# Patient Record
Sex: Male | Born: 1965 | Race: White | Hispanic: No | Marital: Married | Smoking: Current every day smoker
Health system: Southern US, Community
[De-identification: ages and names within clinical notes are randomized; demographics above are authoritative.]

## PROBLEM LIST (undated history)

## (undated) DIAGNOSIS — B029 Zoster without complications: Secondary | ICD-10-CM

## (undated) DIAGNOSIS — T300 Burn of unspecified body region, unspecified degree: Secondary | ICD-10-CM

## (undated) HISTORY — DX: Zoster without complications: B02.9

## (undated) HISTORY — DX: Burn of unspecified body region, unspecified degree: T30.0

---

## 2004-03-20 ENCOUNTER — Ambulatory Visit (HOSPITAL_COMMUNITY): Admission: RE | Admit: 2004-03-20 | Discharge: 2004-03-20 | Payer: Self-pay | Admitting: Family Medicine

## 2012-09-04 ENCOUNTER — Telehealth: Payer: Self-pay | Admitting: Nurse Practitioner

## 2012-09-04 NOTE — Telephone Encounter (Signed)
Long history of L knee pain with continuous pain x 6 months.  It has gotten progressively more unstable recently.  The discomfort is also affecting his sleep.  Has not taken anything OTC or applied a brace.  He has not been evaluated for this pain in the past.    Appt scheduled for tomorrow.  Suggested ibuprofen 2 tabs q 4 hrs or 4 tabs q 8 hrs PRN pain.

## 2012-09-05 ENCOUNTER — Ambulatory Visit (INDEPENDENT_AMBULATORY_CARE_PROVIDER_SITE_OTHER): Payer: BC Managed Care – PPO

## 2012-09-05 ENCOUNTER — Ambulatory Visit (INDEPENDENT_AMBULATORY_CARE_PROVIDER_SITE_OTHER): Payer: BC Managed Care – PPO | Admitting: Family Medicine

## 2012-09-05 ENCOUNTER — Encounter: Payer: Self-pay | Admitting: Family Medicine

## 2012-09-05 VITALS — BP 126/78 | HR 69 | Temp 98.8°F | Ht 69.0 in | Wt 198.0 lb

## 2012-09-05 DIAGNOSIS — S83419A Sprain of medial collateral ligament of unspecified knee, initial encounter: Secondary | ICD-10-CM

## 2012-09-05 DIAGNOSIS — Z23 Encounter for immunization: Secondary | ICD-10-CM

## 2012-09-05 DIAGNOSIS — M25569 Pain in unspecified knee: Secondary | ICD-10-CM

## 2012-09-05 DIAGNOSIS — S83412A Sprain of medial collateral ligament of left knee, initial encounter: Secondary | ICD-10-CM

## 2012-09-05 DIAGNOSIS — M25562 Pain in left knee: Secondary | ICD-10-CM

## 2012-09-05 MED ORDER — MELOXICAM 15 MG PO TABS: 15.0000 mg | ORAL_TABLET | Freq: Every day | ORAL | Status: DC

## 2012-09-05 NOTE — Patient Instructions (Signed)

## 2012-09-11 ENCOUNTER — Telehealth: Payer: Self-pay

## 2012-09-11 NOTE — Telephone Encounter (Signed)
Patient says he is supposed to be referred to Dr. Thurston Hole   (Nothing in referral pool)

## 2012-09-12 ENCOUNTER — Telehealth: Payer: Self-pay

## 2012-09-12 NOTE — Telephone Encounter (Signed)
Patient thought you were referring him to Dr Thurston Hole    Nothing in referral workqueue

## 2012-09-14 NOTE — Telephone Encounter (Signed)
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  I will complete his note by the end of the day.  Thank you.

## 2012-11-14 NOTE — Progress Notes (Signed)
  Subjective:    Patient ID: Russell Shaw, male    DOB: November 06, 1965, 47 y.o.   MRN: [id number]  HPI Comments: Pt with baseline hx/o chronic knee pain  Has had worsening L knee pain over last 1-2 weeks.  No known injury.  Does manual labor on a regular basis.  No knee locking or giving away.  Has not tried anything acutely for treatment.    Knee Pain  The incident occurred more than 1 week ago. There was no injury mechanism. The pain is present in the left knee. The quality of the pain is described as aching. The pain is at a severity of 4/10. The pain is mild.      Review of Systems  All other systems reviewed and are negative.       Objective:   Physical Exam  Constitutional: He appears well-developed and well-nourished.  HENT:  Head: Normocephalic and atraumatic.  Eyes: Conjunctivae are normal. Pupils are equal, round, and reactive to light.  Neck: Normal range of motion.  Cardiovascular: Normal rate and regular rhythm.   Pulmonary/Chest: Effort normal.  Abdominal: Soft.  Musculoskeletal:  + mild pain along L medial knee joint line  Full ROM  Mild pain with resisted knee extension McMurrays mildly positive on R.    Neurological: He is alert.   [facility] reading (PRIMARY) by  Dr. Alvester Morin  Preliminary read with noted medial joint space narrowing as well as mild degenerative changes.                                          Assessment & Plan:  Need for Tdap vaccination - Plan: Tdap vaccine greater than or equal to 7yo IM  Knee pain, left - Plan: DG Knee 1-2 Views Left, meloxicam (MOBIC) 15 MG tablet  Sprain and strain of medial collateral ligament of knee, left, initial encounter - Plan: meloxicam (MOBIC) 15 MG tablet  RICE and NSAIDs.  Will brace  Discussed general care and [facility] red flags.  Consider follow up with ortho/sports medicine if sxs persist.

## 2013-02-15 HISTORY — PX: MENISCUS REPAIR: SHX5179

## 2015-05-13 ENCOUNTER — Encounter: Payer: Self-pay | Admitting: Family Medicine

## 2015-05-13 ENCOUNTER — Ambulatory Visit (INDEPENDENT_AMBULATORY_CARE_PROVIDER_SITE_OTHER): Payer: BLUE CROSS/BLUE SHIELD | Admitting: Family Medicine

## 2015-05-13 VITALS — BP 110/75 | HR 85 | Temp 99.3°F | Wt 197.4 lb

## 2015-05-13 DIAGNOSIS — M791 Myalgia, unspecified site: Secondary | ICD-10-CM

## 2015-05-13 DIAGNOSIS — J101 Influenza due to other identified influenza virus with other respiratory manifestations: Secondary | ICD-10-CM

## 2015-05-13 DIAGNOSIS — L200 Besnier's prurigo: Secondary | ICD-10-CM | POA: Diagnosis not present

## 2015-05-13 DIAGNOSIS — R509 Fever, unspecified: Secondary | ICD-10-CM | POA: Diagnosis not present

## 2015-05-13 DIAGNOSIS — L239 Allergic contact dermatitis, unspecified cause: Secondary | ICD-10-CM

## 2015-05-13 LAB — VERITOR FLU A/B WAIVED
INFLUENZA A: NEGATIVE
Influenza B: NEGATIVE

## 2015-05-13 MED ORDER — BETAMETHASONE SOD PHOS & ACET 6 (3-3) MG/ML IJ SUSP
6.0000 mg | Freq: Once | INTRAMUSCULAR | Status: AC
  Administered 2015-05-13: 6 mg via INTRAMUSCULAR

## 2015-05-13 MED ORDER — OSELTAMIVIR PHOSPHATE 75 MG PO CAPS: 75.0000 mg | ORAL_CAPSULE | Freq: Two times a day (BID) | ORAL | Status: DC

## 2015-05-13 NOTE — Progress Notes (Signed)
Subjective:  Patient ID: Russell Shaw, male    DOB: [birth year]  Age: 50 y.o. MRN: [id number]  CC: Fever and Rash   HPI Russell Shaw presents for  Patient presents with dry cough runny stuffy nose. Diffuse headache of moderate intensity. Patient also has chills and subjective fever. Body aches worst in the back but present in the legs, shoulders, and torso as well. Has sapped the energy to the point that of being unable to perform usual activities other than ADLs. Onset 2 days ago. Patient also has a rash on the abdomen and chest present for 2-3 weeks with minimal itching. He is been training a new puppy and has had them in bed with him. He also had recently been treated for ringworm. The ringworm was present on the mid abdomen.  History Russell Shaw has no past medical history on file.   He has no past surgical history on file.   His family history includes Cancer in his father; Diabetes in his mother; Hypertension in his mother.He reports that he has been smoking Cigarettes.  He has a 35 pack-year smoking history. He has never used smokeless tobacco. He reports that he drinks about 14.4 oz of alcohol per week. He reports that he does not use illicit drugs.  No current outpatient prescriptions on file prior to visit.   No current facility-administered medications on file prior to visit.    ROS Review of Systems  Constitutional: Positive for fever, chills, activity change and appetite change.  HENT: Negative for congestion, ear discharge, ear pain, hearing loss, nosebleeds, postnasal drip, rhinorrhea, sinus pressure, sneezing and trouble swallowing.   Respiratory: Positive for cough. Negative for chest tightness and shortness of breath.   Cardiovascular: Negative for chest pain and palpitations.  Musculoskeletal: Positive for myalgias.  Skin: Positive for rash. Negative for color change.    Objective:  BP 110/75 mmHg  Pulse 85  Temp(Src) 99.3 F (37.4 C) (Oral)  Wt 197 lb  6.4 oz (89.54 kg)  SpO2 99%  Physical Exam  Constitutional: He is oriented to person, place, and time. He appears well-developed and well-nourished.  HENT:  Head: Normocephalic and atraumatic.  Right Ear: Tympanic membrane and external ear normal. No decreased hearing is noted.  Left Ear: Tympanic membrane and external ear normal. No decreased hearing is noted.  Nose: Mucosal edema present. Right sinus exhibits no frontal sinus tenderness. Left sinus exhibits no frontal sinus tenderness.  Mouth/Throat: No oropharyngeal exudate or posterior oropharyngeal erythema.  Eyes: EOM are normal. Pupils are equal, round, and reactive to light.  Neck: No Brudzinski's sign noted.  Pulmonary/Chest: Breath sounds normal. No respiratory distress. He has no wheezes. He has no rales.  Abdominal: Soft. There is no tenderness.  Lymphadenopathy:       Head (right side): No preauricular adenopathy present.       Head (left side): No preauricular adenopathy present.       Right cervical: No superficial cervical adenopathy present.      Left cervical: No superficial cervical adenopathy present.  Neurological: He is alert and oriented to person, place, and time.  Skin: Skin is warm and dry. Rash (maculopapular erythema covering most of the abdomen and chest extending around to the flank slightly. Discrete papules are blanching.) noted.    Assessment & Plan:   Russell Shaw was seen today for fever and rash.  Diagnoses and all orders for this visit:  Influenza A  Fever, unspecified fever cause -  Veritor Flu A/B Waived  Myalgia -     Veritor Flu A/B Waived  Allergic dermatitis -     betamethasone acetate-betamethasone sodium phosphate (CELESTONE) injection 6 mg; Inject 1 mL (6 mg total) into the muscle once.  Other orders -     oseltamivir (TAMIFLU) 75 MG capsule; Take 1 capsule (75 mg total) by mouth 2 (two) times daily.   I have discontinued Russell Shaw meloxicam. I am also having him start on  oseltamivir. We will continue to administer betamethasone acetate-betamethasone sodium phosphate.  Meds ordered this encounter  Medications  . betamethasone acetate-betamethasone sodium phosphate (CELESTONE) injection 6 mg    Sig:   . oseltamivir (TAMIFLU) 75 MG capsule    Sig: Take 1 capsule (75 mg total) by mouth 2 (two) times daily.    Dispense:  10 capsule    Refill:  0     Follow-up: Return in about 1 month (around 06/13/2015) for CPE.  Claretta Fraise, M.D.

## 2017-04-01 ENCOUNTER — Ambulatory Visit: Payer: 59 | Admitting: Family Medicine

## 2017-04-01 ENCOUNTER — Encounter: Payer: Self-pay | Admitting: Family Medicine

## 2017-04-01 VITALS — BP 116/83 | HR 68 | Temp 97.0°F | Ht 69.0 in | Wt 215.0 lb

## 2017-04-01 DIAGNOSIS — L821 Other seborrheic keratosis: Secondary | ICD-10-CM | POA: Diagnosis not present

## 2017-04-01 DIAGNOSIS — L570 Actinic keratosis: Secondary | ICD-10-CM | POA: Diagnosis not present

## 2017-04-01 DIAGNOSIS — L819 Disorder of pigmentation, unspecified: Secondary | ICD-10-CM

## 2017-04-01 NOTE — Patient Instructions (Signed)
Your actinic keratoses were treated with cryotherapy in office today.  See below for home care instructions. The lesion on your right shoulder seems to be consistent with a seborrheic keratosis.  However, if this is truly been enlarging it does warrant a biopsy and I do recommend that you see the dermatologist in March as scheduled.  If it starts spontaneously bleeding, becoming significantly larger prior to that time, please contact their office as you may need to be seen sooner.   Cryosurgery for Skin Conditions, Care After These instructions give you information on caring for yourself after your procedure. Your doctor may also give you more specific instructions. Call your doctor if you have any problems or questions after your procedure. Follow these instructions at home: Caring for the treated area  Follow instructions from your doctor about how to take care of the treated area. Make sure you: ? Keep the area covered with a bandage (dressing) until it heals, or for as long as told by your doctor. ? Wash your hands with soap and water before you change your bandage. If you do not have soap and water, use hand sanitizer. ? Change your bandage as told by your doctor. ? Keep the bandage and the treated area clean and dry. If the bandage gets wet, change it right away. ? Clean the treated area with soap and water.  Check the treated area every day for signs of infection. Check for: ? More redness, swelling, or pain. ? More fluid or blood. ? Warmth. ? Pus or a bad smell. General instructions  Do not pick at your blister. Do not try to break it open. This can cause infection and scarring.  Do not put any medicine, cream, or lotion on the treated area unless told by your doctor.  Take over-the-counter and prescription medicines only as told by your doctor.  Keep all follow-up visits as told by your doctor. This is important. Contact a doctor if:  You have more redness, swelling, or pain  around the treated area.  You have more fluid or blood coming from the treated area.  The treated area feels warm to the touch.  You have pus or a bad smell coming from the treated area.  Your blister gets large and painful. Get help right away if:  You have a fever and have redness spreading from the treated area. Summary  You should keep the treated area and your bandage clean and dry.  Check the treated area every day for signs of infection. Signs include fluid, pus, warmth, or having more redness, swelling, or pain.  Do not pick at your blister. Do not try to break it open. This information is not intended to replace advice given to you by your health care provider. Make sure you discuss any questions you have with your health care provider. Document Released: 04/26/2011 Document Revised: 12/22/2015 Document Reviewed: 12/22/2015 Elsevier Interactive Patient Education  2017 [employer] Seborrheic Keratosis Seborrheic keratosis is a common, noncancerous (benign) skin growth. This condition causes waxy, rough, tan, brown, or black spots to appear on the skin. These skin growths can be flat or raised. What are the causes? The cause of this condition is not known. What increases the risk? This condition is more likely to develop in:  People who have a family history of seborrheic keratosis.  People who are 64 or older.  People who are pregnant.  People who have had estrogen replacement therapy.  What are the signs or symptoms?  This condition often occurs on the face, chest, shoulders, back, or other areas. These growths:  Are usually painless, but may become irritated and itchy.  Can be yellow, brown, black, or other colors.  Are slightly raised or have a flat surface.  Are sometimes rough or wart-like in texture.  Are often waxy on the surface.  Are round or oval-shaped.  Sometimes look like they are "stuck on."  Often occur in groups, but may occur as a  single growth.  How is this diagnosed? This condition is diagnosed with a medical history and physical exam. A sample of the growth may be tested (skin biopsy). You may need to see a skin specialist (dermatologist). How is this treated? Treatment is not usually needed for this condition, unless the growths are irritated or are often bleeding. You may also choose to have the growths removed if you do not like their appearance. Most commonly, these growths are treated with a procedure in which liquid nitrogen is applied to "freeze" off the growth (cryosurgery). They may also be burned off with electricity or cut off. Follow these instructions at home:  Watch your growth for any changes.  Keep all follow-up visits as told by your health care provider. This is important.  Do not scratch or pick at the growth or growths. This can cause them to become irritated or infected. Contact a health care provider if:  You suddenly have many new growths.  Your growth bleeds, itches, or hurts.  Your growth suddenly becomes larger or changes color. This information is not intended to replace advice given to you by your health care provider. Make sure you discuss any questions you have with your health care provider. Document Released: 03/06/2010 Document Revised: 07/10/2015 Document Reviewed: 06/19/2014 Elsevier Interactive Patient Education  2018 [employer].  Actinic Keratosis An actinic keratosis is a precancerous growth on the skin. This means that it could develop into skin cancer if it is not treated. About 1% of these growths (actinic keratoses) turn into skin cancer within one year if they are not treated. It is important to have all of these growths evaluated to determine the best treatment approach. What are the causes? This condition is caused by getting too much ultraviolet (UV) radiation from the sun or other UV light sources. What increases the risk? The following factors may make you more  likely to develop this condition:  Having light-colored skin and blue eyes.  Having blonde or red hair.  Spending a lot of time in the sun.  Inadequate skin protection when outdoors. This may include: ? Not using sunscreen properly. ? Not covering up skin that is exposed to sunlight.  Aging. The risk of developing an actinic keratosis increases with age.  What are the signs or symptoms? Actinic keratoses look like scaly, rough spots of skin.They can be as small as a pinhead or as big as a quarter. They may itch, hurt, or feel sensitive. In most cases, the growths become red. In some cases, they may be skin-colored, light tan, dark tan, pink, or a combination of any of these colors. There may be a small piece of pink or gray skin (skin tag) growing from the actinic keratosis. In some cases, it may be easier to notice actinic keratoses by feeling them, rather than seeing them. Actinic keratoses appear most often on areas of skin that get a lot of sun exposure, including the scalp, face, ears, lips, upper back, forearms, and the backs of the hands.  Sometimes, actinic keratoses disappear, but many reappear a few days to a few weeks later. How is this diagnosed? This condition is usually diagnosed with a physical exam. A tissue sample may be removed from the actinic keratosis and examined under a microscope (biopsy). How is this treated?  Treatment for this condition may include:  Scraping off the actinic keratosis (curettage).  Freezing the actinic keratosis with liquid nitrogen (cryosurgery). This causes the growth to eventually fall off the skin.  Applying medicated creams or gels to destroy the cells in the growth.  Applying chemicals to the actinic keratosis to make the outer layers of skin peel off (chemical peel).  Photodynamic therapy. In this procedure, medicated cream is applied to the actinic keratosis. This cream increases your skin's sensitivity to light. Then, a strong light  is aimed at the actinic keratosis to destroy cells in the growth.  Follow these instructions at home: Skin care  Apply cool, wet cloths (cool compresses) to the affected areas.  Do not scratch your skin.  Check your skin regularly for any growths, especially growths that: ? Start to itch or bleed. ? Change in size, shape, or color. Caring for the treated area  Keep the treated area clean and dry as told by your health care provider.  Do not apply any medicine, cream, or lotion to the treated area unless your health care provider tells you to do that.  Do not pick at blisters or try to break them open. This can cause infection and scarring.  If you have red or irritated skin after treatment, follow instructions from your health care provider about how to take care of the treated area. Make sure you: ? Wash your hands with soap and water before you change your bandage (dressing). If soap and water are not available, use hand sanitizer. ? Change your dressing as told by your health care provider.  If you have red or irritated skin after treatment, check your treated area every day for signs of infection. Check for: ? Swelling, pain, or more redness. ? Fluid or blood. ? Warmth. ? Pus or a bad smell. General instructions  Take over-the-counter and prescription medicines only as told by your health care provider.  Return to your normal activities as told by your health care provider. Ask your health care provider what activities are safe for you.  Do not use any tobacco products, such as cigarettes, chewing tobacco, and e-cigarettes. If you need help quitting, ask your health care provider.  Have a skin exam done every year by a health care provider who is a skin conditions specialist (dermatologist).  Keep all follow-up visits as told by your health care provider. This is important. How is this prevented?  Do not get sunburns.  Try to avoid the sun between 10:00 a.m. and 4:00  p.m. This is when the UV light is the strongest.  Use a sunscreen or sunblock with SPF 30 (sun protection factor 30) or greater.  Apply sunscreen before you are exposed to sunlight, and reapply periodically as often as directed by the instructions on the sunscreen container.  Always wear sunglasses that have UV protection, and always wear hats and clothing to protect your skin from sunlight.  When possible, avoid medicines that increase your sensitivity to sunlight. These include: ? Certain antibiotic medicines. ? Certain water pills (diuretics). ? Certain prescription medicines that are used to treat acne (retinoids).  Do not use tanning beds or other indoor tanning devices. Contact a health  care provider if:  You notice any changes or new growths on your skin.  You have swelling, pain, or more redness around your treated area.  You have fluid or blood coming from your treated area.  Your treated area feels warm to the touch.  You have pus or a bad smell coming from your treated area.  You have a fever.  You have a blister that becomes large and painful. This information is not intended to replace advice given to you by your health care provider. Make sure you discuss any questions you have with your health care provider. Document Released: 04/30/2008 Document Revised: 10/03/2015 Document Reviewed: 10/12/2014 Elsevier Interactive Patient Education  [employer].

## 2017-04-01 NOTE — Progress Notes (Signed)
Subjective: CC: spot on shoulder PCP: Dettinger, Fransisca Kaufmann, MD [id number] Russell Shaw is a 52 y.o. male presenting to clinic today for:  1. Spot on shoulder Patient reports that he has a spot on his right shoulder that he noticed about 3 months ago.  His wife seems to think it has grown since then but he has not noticed a great change in size, texture, color or shape.  He denies any spontaneous bleeding.  No unplanned weight loss, fevers, chills, night sweats.  He notes that he has had several sunburns in his life.  He actually works outside and has a lot of sun exposure regularly.  He now wears sunscreen but did not used to wear sunscreen.  No past medical history of skin cancers.  No family history of skin cancers.  He is a current every day smoker.   ROS: Per HPI  No Known Allergies No past medical history on file.  Current Outpatient Medications:  .  oseltamivir (TAMIFLU) 75 MG capsule, Take 1 capsule (75 mg total) by mouth 2 (two) times daily., Disp: 10 capsule, Rfl: 0 Social History   Socioeconomic History  . Marital status: Married    Spouse name: Not on file  . Number of children: Not on file  . Years of education: Not on file  . Highest education level: Not on file  Social Needs  . Financial resource strain: Not on file  . Food insecurity - worry: Not on file  . Food insecurity - inability: Not on file  . Transportation needs - medical: Not on file  . Transportation needs - non-medical: Not on file  Occupational History  . Not on file  Tobacco Use  . Smoking status: Current Every Day Smoker    Packs/day: 1.00    Years: 35.00    Pack years: 35.00    Types: Cigarettes  . Smokeless tobacco: Never Used  Substance and Sexual Activity  . Alcohol use: Yes    Alcohol/week: 14.4 oz    Types: 24 Cans of beer per week  . Drug use: No  . Sexual activity: Not on file  Other Topics Concern  . Not on file  Social History Narrative  . Not on file   Family History    Problem Relation Age of Onset  . Diabetes Mother   . Hypertension Mother   . Cancer Father        lung    Objective: Office vital signs reviewed. BP 116/83   Pulse 68   Temp (!) 97 F (36.1 C) (Oral)   Ht 5\' 9"  (1.753 m)   Wt 215 lb (97.5 kg)   BMI 31.75 kg/m   Physical Examination:  General: Awake, alert, well nourished, well appearing, No acute distress Skin: fitzpatrick 2; multiple freckles/nevi throughout.  He has a blanching, erythematous rash along the upper chest.  Lesion of concern is on the right apex of the shoulder.  It is approximately 0.5 x 0.5 inches in diameter.  It is slightly raised and rough.  There is some pigment throughout.  He has another similar lesion on the right forearm that is about 1.75 x 1.5 cm. 2 keratotic lesions appreciated in the mid back.  The first being about 1 mm x 1 mm in diameter.  The second being about 2 mm x 2 mm in diameter and located inferior and to the right of the first lesion.  Cryotherapy Procedure:  Risks and benefits of procedure were reviewed with the patient.  Written consent obtained and scanned into the chart.  Lesion of concern was identified and located on the mid upper back at the T 8 level.  Liquid nitrogen was applied to area of concern and extending out 1 millimeters beyond the border of the lesion.  Treated area was allowed to come back to room temperature before treating it a second time.  Patient tolerated procedure well and there were no immediate complications.  Home care instructions were reviewed with the patient and a handout was provided.  Second Lesion of concern was identified and located on right mid back at the T10 level.  Liquid nitrogen was applied to area of concern and extending out 1.5 millimeters beyond the border of the lesion.  Treated area was allowed to come back to room temperature before treating it a second time.  Patient tolerated procedure well and there were no immediate complications.  Home care  instructions were reviewed with the patient and a handout was provided.  Assessment/ Plan: 52 y.o. male   1. Seborrheic keratoses Patient has multiple seborrheic keratosis.  I actually think that the right shoulder lesion is likely a benign seborrheic keratosis.  However, if the lesion is truly enlarging, I did recommend that he have this evaluated by dermatology and biopsied, as rapid enlargement would be an atypical presentation and suggest against an SK diagnosis.  2. Actinic keratosis Treated today with cryotherapy.  See above note.  Patient tolerated this well.  Handout provided.  3. Pigmented skin lesion of uncertain nature Patient has an appointment with dermatology.  He is unsure as to which dermatologist he will be seeing on 21 March.  He will contact our office with this information so that today's note can be sent to them.  Reasons for more urgent evaluation reviewed with patient.  He voiced good understanding will follow-up as needed.  Highly encouraged patient to practice good skin care, including daily use of sunscreen.   Janora Norlander, DO [place] [phone]

## 2017-05-05 DIAGNOSIS — L814 Other melanin hyperpigmentation: Secondary | ICD-10-CM | POA: Diagnosis not present

## 2017-05-05 DIAGNOSIS — L570 Actinic keratosis: Secondary | ICD-10-CM | POA: Diagnosis not present

## 2017-05-05 DIAGNOSIS — L821 Other seborrheic keratosis: Secondary | ICD-10-CM | POA: Diagnosis not present

## 2017-05-05 DIAGNOSIS — D225 Melanocytic nevi of trunk: Secondary | ICD-10-CM | POA: Diagnosis not present

## 2019-01-23 ENCOUNTER — Other Ambulatory Visit: Payer: Self-pay

## 2019-01-24 ENCOUNTER — Ambulatory Visit: Payer: 59 | Admitting: Family Medicine

## 2020-02-13 ENCOUNTER — Ambulatory Visit: Payer: 59 | Admitting: Family Medicine

## 2020-02-13 ENCOUNTER — Ambulatory Visit: Payer: 59 | Admitting: Nurse Practitioner

## 2020-02-13 ENCOUNTER — Other Ambulatory Visit: Payer: Self-pay

## 2020-02-13 ENCOUNTER — Encounter: Payer: Self-pay | Admitting: Nurse Practitioner

## 2020-02-13 VITALS — BP 127/81 | HR 82 | Temp 98.4°F | Resp 20 | Ht 69.0 in | Wt 221.0 lb

## 2020-02-13 DIAGNOSIS — R42 Dizziness and giddiness: Secondary | ICD-10-CM

## 2020-02-13 MED ORDER — MECLIZINE HCL 25 MG PO TABS: 25.0000 mg | ORAL_TABLET | Freq: Three times a day (TID) | ORAL | 0 refills | Status: DC | PRN

## 2020-02-13 NOTE — Progress Notes (Signed)
Subjective:    Patient ID: Russell Shaw, male    DOB: [birth year], 54 y.o.   MRN: [id number]   Chief Complaint: Dizziness (Started Sunday/)   HPI Patient comes in today c/o dizziness. He says started on Sunday morning when he was rolling over on bed. Last about 15 min. Sunday night he had another episode when rolling to his left side. Blood pressure was 138/80. Monday morning rolling over it happened again. When he finally got out of bed he was unsteady on his feet. Improved as day went on . Tuesday morning he was dizzy for a few minutes. This morning he did not feel dizzy this morning or any today.   Review of Systems  Constitutional: Negative for diaphoresis.  Eyes: Negative for pain.  Respiratory: Negative for shortness of breath.   Cardiovascular: Negative for chest pain, palpitations and leg swelling.  Gastrointestinal: Negative for abdominal pain.  Endocrine: Negative for polydipsia.  Skin: Negative for rash.  Neurological: Positive for dizziness (but not since yesterday). Negative for weakness and headaches.  Hematological: Does not bruise/bleed easily.  All other systems reviewed and are negative.      Objective:   Physical Exam Vitals and nursing note reviewed.  Constitutional:      Appearance: Normal appearance. He is well-developed and well-nourished.  HENT:     Head: Normocephalic.     Nose: Nose normal.     Mouth/Throat:     Mouth: Oropharynx is clear and moist.  Eyes:     Extraocular Movements: EOM normal.     Pupils: Pupils are equal, round, and reactive to light.  Neck:     Thyroid: No thyroid mass or thyromegaly.     Vascular: No carotid bruit or JVD.     Trachea: Phonation normal.  Cardiovascular:     Rate and Rhythm: Normal rate and regular rhythm.  Pulmonary:     Effort: Pulmonary effort is normal. No respiratory distress.     Breath sounds: Normal breath sounds.  Abdominal:     General: Aorta is normal.     Tenderness: There is no abdominal  tenderness.  Musculoskeletal:        General: Normal range of motion.     Cervical back: Normal range of motion and neck supple.  Lymphadenopathy:     Cervical: No cervical adenopathy.  Skin:    General: Skin is warm and dry.  Neurological:     Mental Status: He is alert and oriented to person, place, and time.     Cranial Nerves: Cranial nerve deficit present.     Sensory: Sensory deficit present.  Psychiatric:        Mood and Affect: Mood and affect normal.        Behavior: Behavior normal.        Thought Content: Thought content normal.        Judgment: Judgment normal.    BP 127/81   Pulse 82   Temp 98.4 F (36.9 C) (Temporal)   Resp 20   Ht 5\' 9"  (1.753 m)   Wt 221 lb (100.2 kg)   SpO2 95%   BMI 32.64 kg/m         Assessment & Plan:  in today with chief complaint of Dizziness (Started Sunday/)   1. Vertigo Fore fluids RTOprn - meclizine (ANTIVERT) 25 MG tablet; Take 1 tablet (25 mg total) by mouth 3 (three) times daily as needed for dizziness.  Dispense: 30 tablet; Refill: 0  The above assessment and management plan was discussed with the patient. The patient verbalized understanding of and has agreed to the management plan. Patient is aware to call the clinic if symptoms persist or worsen. Patient is aware when to return to the clinic for a follow-up visit. Patient educated on when it is appropriate to go to the emergency department.   Mary-Margaret Spencer Peterkin, FNP   

## 2020-02-13 NOTE — Patient Instructions (Signed)

## 2020-02-18 ENCOUNTER — Ambulatory Visit: Payer: 59 | Admitting: Family Medicine

## 2020-10-28 ENCOUNTER — Encounter: Payer: Self-pay | Admitting: Registered Nurse

## 2020-10-28 ENCOUNTER — Ambulatory Visit: Payer: 59 | Admitting: Registered Nurse

## 2020-10-28 ENCOUNTER — Other Ambulatory Visit: Payer: Self-pay

## 2020-10-28 VITALS — BP 127/75 | HR 63 | Temp 98.3°F | Resp 16 | Ht 69.0 in | Wt 223.0 lb

## 2020-10-28 DIAGNOSIS — M25511 Pain in right shoulder: Secondary | ICD-10-CM

## 2020-10-28 DIAGNOSIS — R42 Dizziness and giddiness: Secondary | ICD-10-CM

## 2020-10-28 MED ORDER — METHYLPREDNISOLONE ACETATE 80 MG/ML IJ SUSP
80.0000 mg | Freq: Once | INTRAMUSCULAR | Status: AC
  Administered 2020-10-28: 80 mg via INTRAMUSCULAR

## 2020-10-28 MED ORDER — DICLOFENAC SODIUM 75 MG PO TBEC: 75.0000 mg | DELAYED_RELEASE_TABLET | Freq: Two times a day (BID) | ORAL | 1 refills | Status: DC

## 2020-10-28 MED ORDER — MECLIZINE HCL 25 MG PO TABS: 25.0000 mg | ORAL_TABLET | Freq: Three times a day (TID) | ORAL | 2 refills | Status: DC | PRN

## 2020-10-28 NOTE — Patient Instructions (Signed)
Mr. Moraski -   Doristine Devoid to meet you   In brief:  Shoulder pain: suspect tendonitis. Steroid shot in the muscle today, diclofenac 1-2 times daily as needed going forward.  Vertigo: meclizine, hydrate  Call if symptoms worsening or failing to improve  Thank you  Rich

## 2020-10-28 NOTE — Progress Notes (Signed)
Established Patient Office Visit  Subjective:  Patient ID: Russell Shaw, male    DOB: 1965/05/07  Age: 55 y.o. MRN: [id number]  CC:  Chief Complaint  Patient presents with   Dizziness    Patient states he has been having some issues with his vertigo and had a prescription for meclizine before and it seemed to help. And also having some right shoulder pain for about  months that's getting worse feels like he has been stabbed in it.    HPI Russell Shaw presents for shoulder pain, vertigo  Bppv Dx in remote past Used meclizine previously with good effect, no AE. Last fill was 30 tabs in Dec 2021. Onset of symptoms within past 1-2 days.  Same as previous, denies chest pain, nvd, headaches, visual changes  Shoulder pain Right shoulder Onset around 1-2 mo ago Stabbing pain on anterior joint line Worsening No acute injury or trauma noted. Notes climbs, lifts heavy objects at work  History reviewed. No pertinent past medical history.  History reviewed. No pertinent surgical history.  Family History  Problem Relation Age of Onset   Diabetes Mother    Hypertension Mother    Cancer Father        lung    Social History   Socioeconomic History   Marital status: Married    Spouse name: Not on file   Number of children: Not on file   Years of education: Not on file   Highest education level: Not on file  Occupational History   Not on file  Tobacco Use   Smoking status: Every Day    Packs/day: 1.00    Years: 35.00    Pack years: 35.00    Types: Cigarettes   Smokeless tobacco: Never  Substance and Sexual Activity   Alcohol use: Yes    Alcohol/week: 24.0 standard drinks    Types: 24 Cans of beer per week   Drug use: No   Sexual activity: Not on file  Other Topics Concern   Not on file  Social History Narrative   Not on file   Social Determinants of Health   Financial Resource Strain: Not on file  Food Insecurity: Not on file  Transportation Needs:  Not on file  Physical Activity: Not on file  Stress: Not on file  Social Connections: Not on file  Intimate Partner Violence: Not on file    Outpatient Medications Prior to Visit  Medication Sig Dispense Refill   meclizine (ANTIVERT) 25 MG tablet Take 1 tablet (25 mg total) by mouth 3 (three) times daily as needed for dizziness. 30 tablet 0   No facility-administered medications prior to visit.    No Known Allergies  ROS Review of Systems  Constitutional: Negative.   HENT: Negative.    Eyes: Negative.   Respiratory: Negative.    Cardiovascular: Negative.   Gastrointestinal: Negative.   Genitourinary: Negative.   Musculoskeletal:  Positive for arthralgias. Negative for back pain, gait problem, joint swelling, myalgias, neck pain and neck stiffness.  Skin: Negative.   Neurological:  Positive for dizziness. Negative for tremors, seizures, syncope, facial asymmetry, speech difficulty, weakness, light-headedness, numbness and headaches.  Psychiatric/Behavioral: Negative.    All other systems reviewed and are negative.    Objective:    Physical Exam Constitutional:      General: He is not in acute distress.    Appearance: Normal appearance. He is normal weight. He is not ill-appearing, toxic-appearing or diaphoretic.  Eyes:  Extraocular Movements: Extraocular movements intact.     Pupils: Pupils are equal, round, and reactive to light.  Cardiovascular:     Rate and Rhythm: Normal rate and regular rhythm.     Heart sounds: Normal heart sounds. No murmur heard.   No friction rub. No gallop.  Pulmonary:     Effort: Pulmonary effort is normal. No respiratory distress.     Breath sounds: Normal breath sounds. No stridor. No wheezing, rhonchi or rales.  Chest:     Chest wall: No tenderness.  Musculoskeletal:        General: Tenderness present. No swelling, deformity or signs of injury. Normal range of motion.     Right lower leg: No edema.     Left lower leg: No edema.      Comments: Pain with elevation of elbow over shoulder. Pain with internal rotation  Skin:    General: Skin is warm and dry.     Coloration: Skin is not jaundiced or pale.     Findings: No bruising, erythema, lesion or rash.  Neurological:     General: No focal deficit present.     Mental Status: He is alert and oriented to person, place, and time. Mental status is at baseline.     Cranial Nerves: No cranial nerve deficit.     Motor: No weakness.     Gait: Gait normal.  Psychiatric:        Mood and Affect: Mood normal.        Behavior: Behavior normal.        Thought Content: Thought content normal.        Judgment: Judgment normal.    BP 127/75   Pulse 63   Temp 98.3 F (36.8 C) (Temporal)   Resp 16   Ht _0  (1.753 m)   Wt 223 lb (101.2 kg)   SpO2 98%   BMI 32.93 kg/m  Wt Readings from Last 3 Encounters:  10/28/20 223 lb (101.2 kg)  02/13/20 221 lb (100.2 kg)  04/01/17 215 lb (97.5 kg)     There are no preventive care reminders to display for this patient.  There are no preventive care reminders to display for this patient.  No results found for: TSH No results found for: WBC, HGB, HCT, MCV, PLT No results found for: NA, K, CHLORIDE, CO2, GLUCOSE, BUN, CREATININE, BILITOT, ALKPHOS, AST, ALT, PROT, ALBUMIN, CALCIUM, ANIONGAP, EGFR, GFR No results found for: CHOL No results found for: HDL No results found for: LDLCALC No results found for: TRIG No results found for: CHOLHDL No results found for: HGBA1C    Assessment & Plan:   Problem List Items Addressed This Visit   None Visit Diagnoses     Acute pain of right shoulder    -  Primary   Relevant Medications   methylPREDNISolone acetate (DEPO-MEDROL) injection 80 mg   diclofenac (VOLTAREN) 75 MG EC tablet   Vertigo       Relevant Medications   meclizine (ANTIVERT) 25 MG tablet       Meds ordered this encounter  Medications   meclizine (ANTIVERT) 25 MG tablet    Sig: Take 1 tablet (25 mg total) by  mouth 3 (three) times daily as needed for dizziness.    Dispense:  30 tablet    Refill:  2    Order Specific Question:   Supervising Provider    Answer:   Carlota Raspberry, Krystal R [[id number]]   methylPREDNISolone acetate (DEPO-MEDROL) injection 80 mg  diclofenac (VOLTAREN) 75 MG EC tablet    Sig: Take 1 tablet (75 mg total) by mouth 2 (two) times daily.    Dispense:  30 tablet    Refill:  1    Order Specific Question:   Supervising Provider    Answer:   Carlota Raspberry, Darrik R [[id number]]    Follow-up: Return if symptoms worsen or fail to improve.   PLAN Diclofenac and depo medrol for pain. Continue to monitor. Suspect tendonitis but consider OA Refill meclizine Return if worsening or failing to improve Patient encouraged to call clinic with any questions, comments, or concerns.  Maximiano Coss, NP

## 2021-01-05 ENCOUNTER — Ambulatory Visit: Payer: 59 | Admitting: Registered Nurse

## 2021-01-05 ENCOUNTER — Encounter: Payer: Self-pay | Admitting: Registered Nurse

## 2021-01-05 ENCOUNTER — Other Ambulatory Visit: Payer: Self-pay

## 2021-01-05 VITALS — BP 119/77 | HR 81 | Temp 98.4°F | Resp 18 | Ht 70.0 in | Wt 225.4 lb

## 2021-01-05 DIAGNOSIS — F17210 Nicotine dependence, cigarettes, uncomplicated: Secondary | ICD-10-CM | POA: Diagnosis not present

## 2021-01-05 DIAGNOSIS — Z1329 Encounter for screening for other suspected endocrine disorder: Secondary | ICD-10-CM | POA: Diagnosis not present

## 2021-01-05 DIAGNOSIS — Z13228 Encounter for screening for other metabolic disorders: Secondary | ICD-10-CM

## 2021-01-05 DIAGNOSIS — Z13220 Encounter for screening for lipoid disorders: Secondary | ICD-10-CM | POA: Diagnosis not present

## 2021-01-05 DIAGNOSIS — Z130 Encounter for screening for diseases of the blood and blood-forming organs and certain disorders involving the immune mechanism: Secondary | ICD-10-CM

## 2021-01-05 DIAGNOSIS — M25511 Pain in right shoulder: Secondary | ICD-10-CM

## 2021-01-05 NOTE — Patient Instructions (Signed)
[name] ordered to [facility] on [address]. Stop by at your convenience or give them a call at [phone] to schedule if you'd prefer.  I will refer to Dr. Justice Shaw for shoulder pain. His office will contact you to schedule.  I will refer you to Lung Cancer Screening through [facility]. They will contact you to schedule.   I have placed future orders for labs - give [place] a call when you are able to come by and have these collected. We want to monitor sugars, cholesterol, blood counts, liver function, and kidney function at least once annually.   Thank you  Russell Shaw
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
## 2021-01-21 ENCOUNTER — Ambulatory Visit
Admission: RE | Admit: 2021-01-21 | Discharge: 2021-01-21 | Disposition: A | Discharge status: Home / Self Care | Payer: 59 | Source: Ambulatory Visit | Attending: Registered Nurse | Admitting: Registered Nurse

## 2021-01-21 DIAGNOSIS — M25511 Pain in right shoulder: Secondary | ICD-10-CM

## 2022-05-05 IMAGING — CR DG SHOULDER 2+V*R*
4 series · 4 of 4 positions shown · non-contrast
Comparison: None.

CLINICAL DATA: Chronic right shoulder pain.

EXAM:
RIGHT SHOULDER - 2+ VIEW

[w shoulder grashey right]
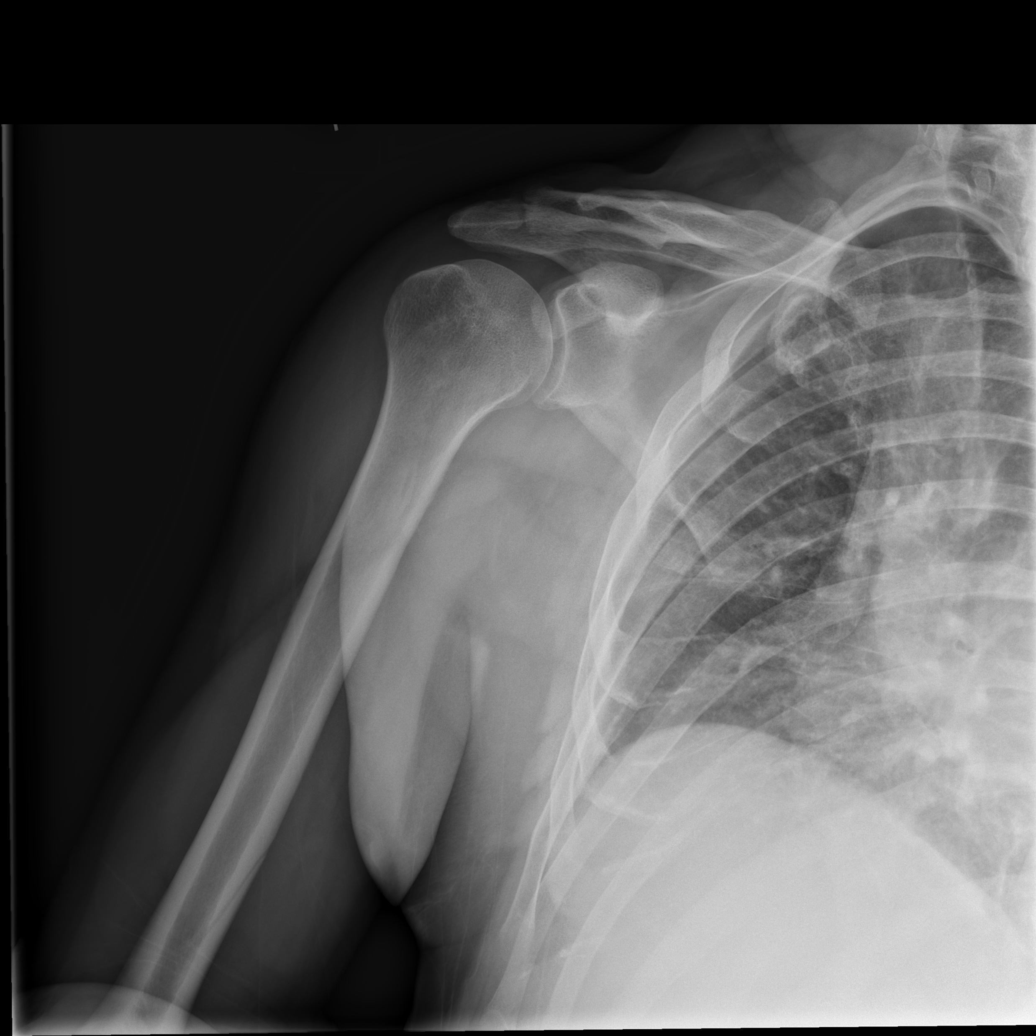

[w shoulder y-view right (1 of 2)]
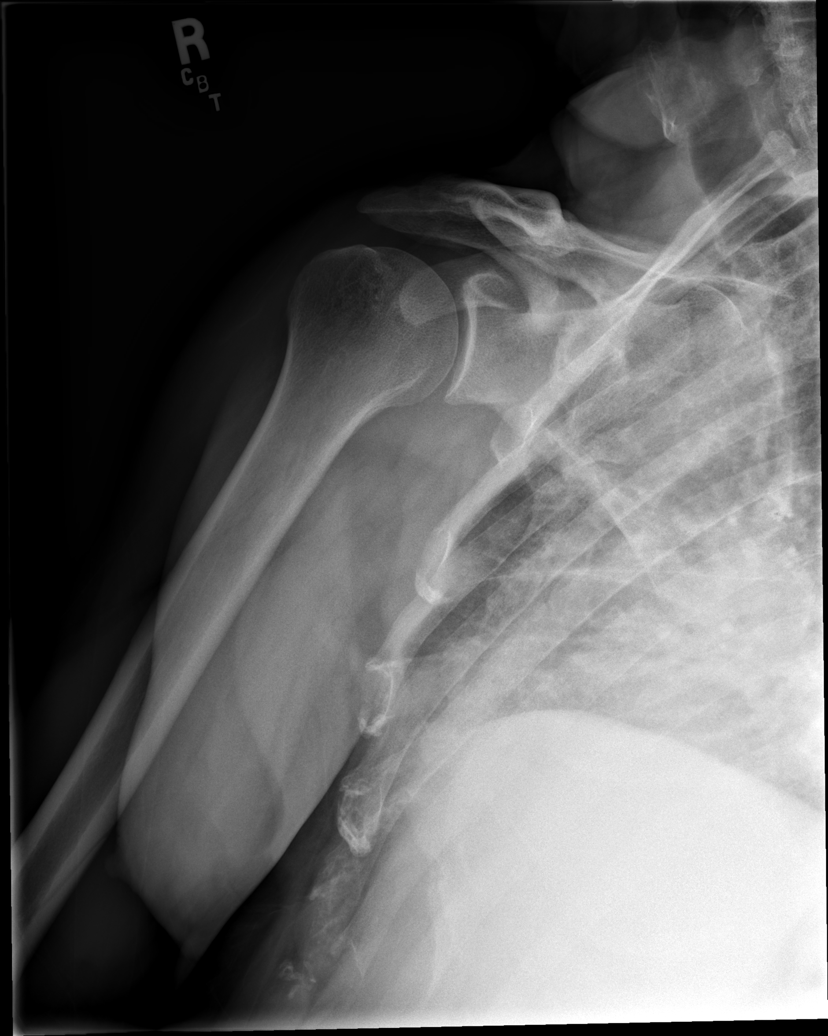

[w shoulder y-view right (2 of 2)]
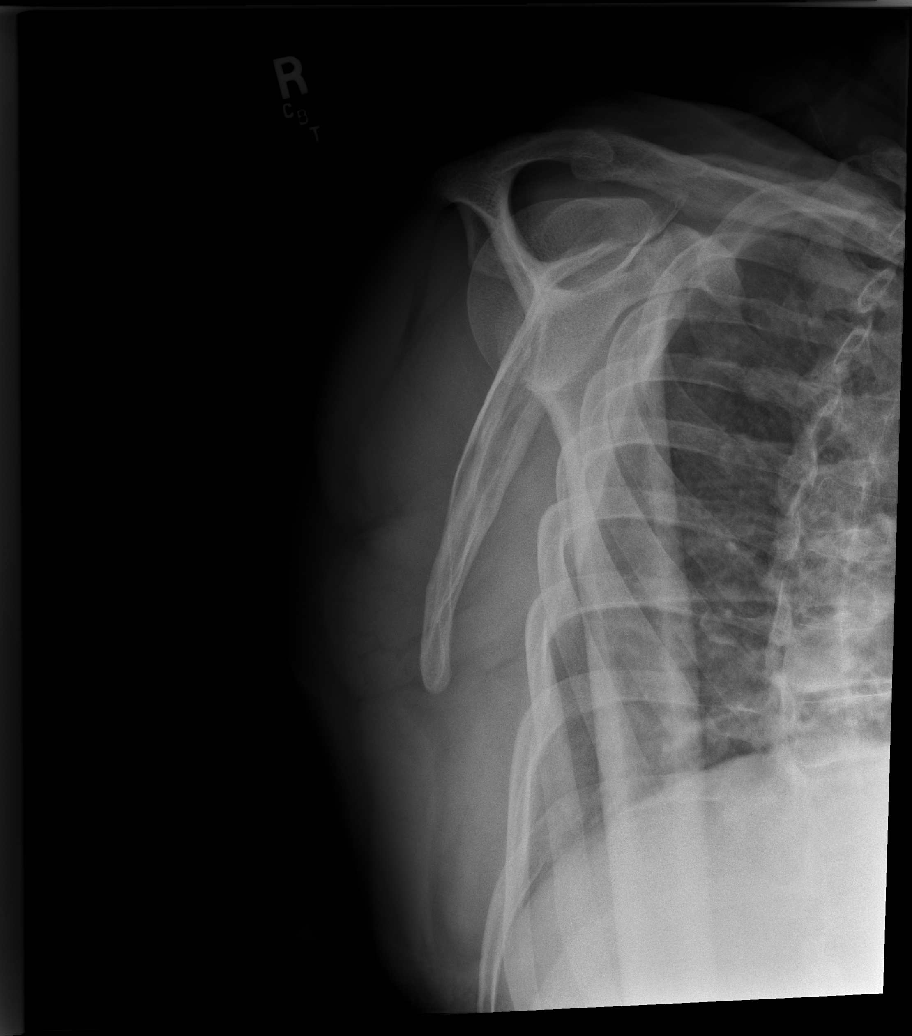

[x shoulder axillary right]
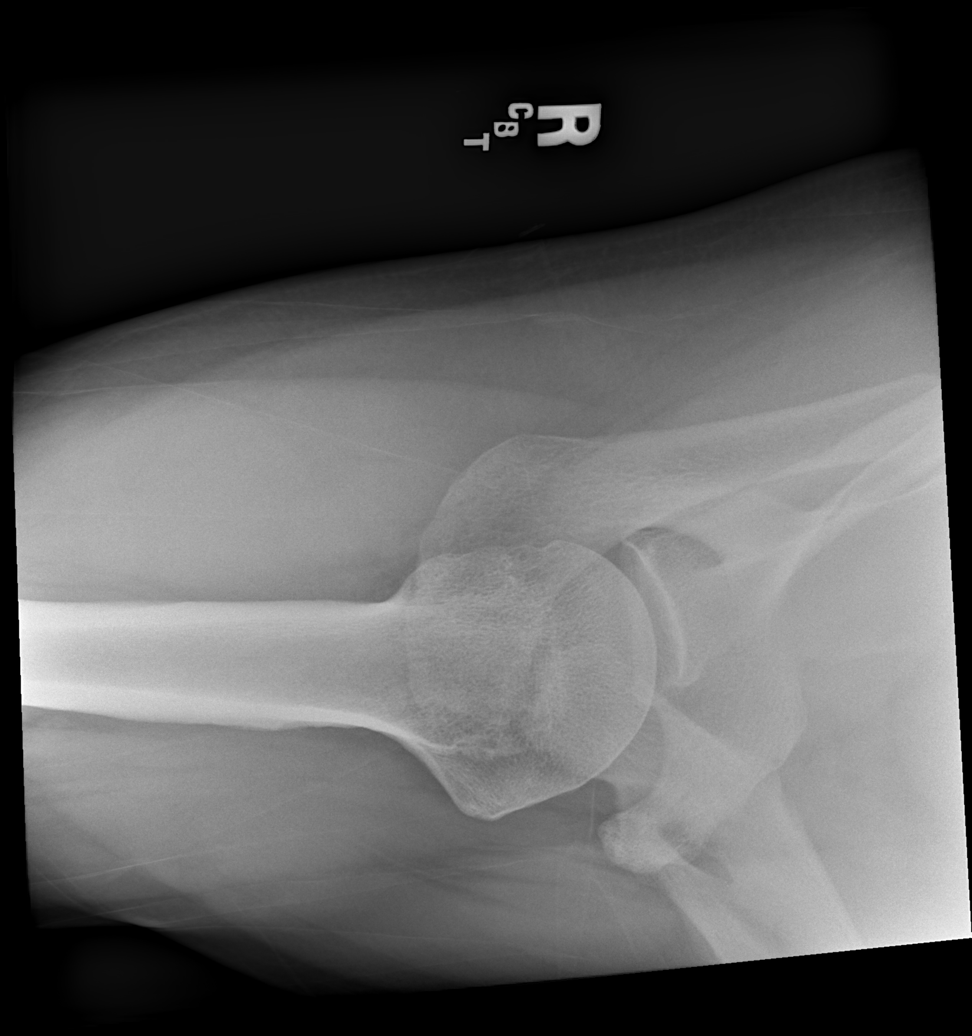

[4 of 4 positions shown; findings below may reference images not displayed]

FINDINGS: There is no evidence of fracture or dislocation. Mild degenerative
changes are seen involving the right acromioclavicular joint. Soft
tissues are unremarkable.
IMPRESSION: Mild degenerative changes involving the right acromioclavicular
joint.

## 2022-07-08 HISTORY — PX: KNEE ARTHROSCOPY: SUR90

## 2022-07-21 ENCOUNTER — Other Ambulatory Visit: Payer: Self-pay

## 2022-07-21 ENCOUNTER — Encounter (HOSPITAL_COMMUNITY): Payer: Self-pay | Admitting: Orthopedic Surgery

## 2022-07-21 NOTE — Progress Notes (Signed)
SDW call  Patient was given pre-op instructions over the phone. Patient verbalized understanding of instructions provided.    PCP - Janeece Agee, NP Cardiologist - denies Pulmonary: denies   PPM/ICD - denies   Chest x-ray - n/a EKG -  n/a Stress Test - ECHO -  Cardiac Cath -   Sleep Study/sleep apnea/CPAP: denies  Non-diabetic   Blood Thinner Instructions: Denies Aspirin Instructions:Hold   ERAS Protcol - Yes, clear liquids until 0730 PRE-SURGERY Ensure or G2-    COVID TEST- n/a    Anesthesia review: No   Patient denies shortness of breath, fever, cough and chest pain over the phone call  Your procedure is scheduled on Thursday July 22, 2022  Report to [facility] Main Entrance "A" at 0800 A.M., then check in with the Admitting office.  Call this number if you have problems the morning of surgery:  [phone]   If you have any questions prior to your surgery date call [phone]: Open Monday-Friday 8am-4pm If you experience any cold or flu symptoms such as cough, fever, chills, shortness of breath, etc. between now and your scheduled surgery, please notify us at the above number    Remember:  Do not eat after midnight the night before your surgery  You may drink clear liquids until  0730  the morning of your surgery.   Clear liquids allowed are: Water, Non-Citrus Juices (without pulp), Carbonated Beverages, Clear Tea, Black Coffee ONLY (NO MILK, CREAM OR POWDERED CREAMER of any kind), and Gatorade   Take these medicines the morning of surgery with A SIP OF WATER:  Keflex  As needed: Norco  As of today, STOP taking any Aspirin (unless otherwise instructed by your surgeon) Aleve, Naproxen, Ibuprofen, Motrin, Advil, Goody's, BC's, all herbal medications, fish oil, and all vitamins.
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
## 2022-07-22 ENCOUNTER — Observation Stay (HOSPITAL_COMMUNITY)
Admission: RE | Admit: 2022-07-22 | Discharge: 2022-07-23 | Disposition: A | Discharge status: Home Health | Payer: 59 | Attending: Orthopedic Surgery | Admitting: Orthopedic Surgery

## 2022-07-22 ENCOUNTER — Encounter (HOSPITAL_COMMUNITY)
Admission: RE | Disposition: A | Discharge status: Home Health | Payer: Self-pay | Source: Home / Self Care | Attending: Orthopedic Surgery

## 2022-07-22 ENCOUNTER — Encounter (HOSPITAL_COMMUNITY): Payer: Self-pay | Admitting: Orthopedic Surgery

## 2022-07-22 ENCOUNTER — Other Ambulatory Visit: Payer: Self-pay

## 2022-07-22 ENCOUNTER — Ambulatory Visit (HOSPITAL_COMMUNITY): Payer: 59

## 2022-07-22 ENCOUNTER — Ambulatory Visit (HOSPITAL_BASED_OUTPATIENT_CLINIC_OR_DEPARTMENT_OTHER): Payer: 59

## 2022-07-22 DIAGNOSIS — M009 Pyogenic arthritis, unspecified: Principal | ICD-10-CM | POA: Insufficient documentation

## 2022-07-22 DIAGNOSIS — F17210 Nicotine dependence, cigarettes, uncomplicated: Secondary | ICD-10-CM | POA: Diagnosis not present

## 2022-07-22 DIAGNOSIS — Z79899 Other long term (current) drug therapy: Secondary | ICD-10-CM | POA: Insufficient documentation

## 2022-07-22 DIAGNOSIS — T8140XA Infection following a procedure, unspecified, initial encounter: Secondary | ICD-10-CM | POA: Diagnosis present

## 2022-07-22 DIAGNOSIS — Z7982 Long term (current) use of aspirin: Secondary | ICD-10-CM | POA: Diagnosis not present

## 2022-07-22 DIAGNOSIS — M00062 Staphylococcal arthritis, left knee: Secondary | ICD-10-CM | POA: Diagnosis not present

## 2022-07-22 HISTORY — PX: INCISION AND DRAINAGE: SHX5863

## 2022-07-22 LAB — CBC
HCT: 42.1 % (ref 39.0–52.0)
Hemoglobin: 14 g/dL (ref 13.0–17.0)
MCH: 31.1 pg (ref 26.0–34.0)
MCHC: 33.3 g/dL (ref 30.0–36.0)
MCV: 93.6 fL (ref 80.0–100.0)
Platelets: 308 10*3/uL (ref 150–400)
RBC: 4.5 MIL/uL (ref 4.22–5.81)
RDW: 12.8 % (ref 11.5–15.5)
WBC: 9.3 10*3/uL (ref 4.0–10.5)
nRBC: 0 % (ref 0.0–0.2)

## 2022-07-22 LAB — HIV ANTIBODY (ROUTINE TESTING W REFLEX): HIV Screen 4th Generation wRfx: NONREACTIVE

## 2022-07-22 LAB — C-REACTIVE PROTEIN: CRP: 13.2 mg/dL — ABNORMAL HIGH (ref <1.0)

## 2022-07-22 LAB — BASIC METABOLIC PANEL
Anion gap: 10 (ref 5–15)
BUN: 26 mg/dL — ABNORMAL HIGH (ref 6–20)
CO2: 23 mmol/L (ref 22–32)
Calcium: 8.7 mg/dL — ABNORMAL LOW (ref 8.9–10.3)
Chloride: 102 mmol/L (ref 98–111)
Creatinine, Ser: 1.2 mg/dL (ref 0.61–1.24)
GFR, Estimated: >60 mL/min (ref >60)
Glucose, Bld: 162 mg/dL — ABNORMAL HIGH (ref 70–99)
Potassium: 3.9 mmol/L (ref 3.5–5.1)
Sodium: 135 mmol/L (ref 135–145)

## 2022-07-22 LAB — SURGICAL PCR SCREEN
MRSA, PCR: NEGATIVE
Staphylococcus aureus: POSITIVE — AB

## 2022-07-22 LAB — AEROBIC/ANAEROBIC CULTURE W GRAM STAIN (SURGICAL/DEEP WOUND)

## 2022-07-22 LAB — SEDIMENTATION RATE: Sed Rate: 80 mm/hr — ABNORMAL HIGH (ref 0–16)

## 2022-07-22 LAB — HEPATITIS C ANTIBODY: HCV Ab: NONREACTIVE

## 2022-07-22 SURGERY — INCISION AND DRAINAGE
Anesthesia: General | Laterality: Left

## 2022-07-22 MED ORDER — POVIDONE-IODINE 10 % EX SWAB
2.0000 | Freq: Once | CUTANEOUS | Status: AC
  Administered 2022-07-22: 2 via TOPICAL

## 2022-07-22 MED ORDER — CEFAZOLIN SODIUM-DEXTROSE 2-4 GM/100ML-% IV SOLN
2.0000 g | Freq: Three times a day (TID) | INTRAVENOUS | Status: DC
  Administered 2022-07-22 – 2022-07-23 (×3): 2 g via INTRAVENOUS
  Filled 2022-07-22 (×3): qty 100

## 2022-07-22 MED ORDER — SODIUM CHLORIDE 0.9 % IR SOLN
Status: DC | PRN
  Administered 2022-07-22: 18000 mL

## 2022-07-22 MED ORDER — BISACODYL 10 MG RE SUPP: 10.0000 mg | Freq: Every day | RECTAL | Status: DC | PRN

## 2022-07-22 MED ORDER — KETOROLAC TROMETHAMINE 30 MG/ML IJ SOLN
INTRAMUSCULAR | Status: AC
  Filled 2022-07-22: qty 1

## 2022-07-22 MED ORDER — GLYCOPYRROLATE PF 0.2 MG/ML IJ SOSY
PREFILLED_SYRINGE | INTRAMUSCULAR | Status: AC
  Filled 2022-07-22: qty 1

## 2022-07-22 MED ORDER — POTASSIUM CHLORIDE IN NACL 20-0.45 MEQ/L-% IV SOLN
INTRAVENOUS | Status: DC
  Filled 2022-07-22 (×3): qty 1000

## 2022-07-22 MED ORDER — DOCUSATE SODIUM 100 MG PO CAPS
100.0000 mg | ORAL_CAPSULE | Freq: Two times a day (BID) | ORAL | Status: DC
  Administered 2022-07-22 – 2022-07-23 (×2): 100 mg via ORAL
  Filled 2022-07-22 (×2): qty 1

## 2022-07-22 MED ORDER — ONDANSETRON HCL 4 MG/2ML IJ SOLN: 4.0000 mg | Freq: Four times a day (QID) | INTRAMUSCULAR | Status: DC | PRN

## 2022-07-22 MED ORDER — METOCLOPRAMIDE HCL 5 MG PO TABS: 5.0000 mg | ORAL_TABLET | Freq: Three times a day (TID) | ORAL | Status: DC | PRN

## 2022-07-22 MED ORDER — HYDROCODONE-ACETAMINOPHEN 5-325 MG PO TABS: 1.0000 | ORAL_TABLET | ORAL | Status: DC | PRN

## 2022-07-22 MED ORDER — MORPHINE SULFATE (PF) 2 MG/ML IV SOLN: 0.5000 mg | INTRAVENOUS | Status: DC | PRN

## 2022-07-22 MED ORDER — CHLORHEXIDINE GLUCONATE 0.12 % MT SOLN
OROMUCOSAL | Status: AC
  Administered 2022-07-22: 15 mL via OROMUCOSAL
  Filled 2022-07-22: qty 15

## 2022-07-22 MED ORDER — BUPIVACAINE HCL 0.5 % IJ SOLN
INTRAMUSCULAR | Status: DC | PRN
  Administered 2022-07-22: 10 mL

## 2022-07-22 MED ORDER — PROPOFOL 10 MG/ML IV BOLUS
INTRAVENOUS | Status: DC | PRN
  Administered 2022-07-22: 200 mg via INTRAVENOUS

## 2022-07-22 MED ORDER — HYDROCODONE-ACETAMINOPHEN 7.5-325 MG PO TABS: 1.0000 | ORAL_TABLET | ORAL | Status: DC | PRN

## 2022-07-22 MED ORDER — ONDANSETRON HCL 4 MG/2ML IJ SOLN
INTRAMUSCULAR | Status: DC | PRN
  Administered 2022-07-22: 4 mg via INTRAVENOUS

## 2022-07-22 MED ORDER — LIDOCAINE 2% (20 MG/ML) 5 ML SYRINGE
INTRAMUSCULAR | Status: AC
  Filled 2022-07-22: qty 5

## 2022-07-22 MED ORDER — MIDAZOLAM HCL 2 MG/2ML IJ SOLN
INTRAMUSCULAR | Status: DC | PRN
  Administered 2022-07-22: 2 mg via INTRAVENOUS

## 2022-07-22 MED ORDER — VANCOMYCIN HCL 750 MG/150ML IV SOLN
750.0000 mg | Freq: Two times a day (BID) | INTRAVENOUS | Status: DC
  Administered 2022-07-23: 750 mg via INTRAVENOUS
  Filled 2022-07-22: qty 150

## 2022-07-22 MED ORDER — LIDOCAINE 2% (20 MG/ML) 5 ML SYRINGE
INTRAMUSCULAR | Status: DC | PRN
  Administered 2022-07-22: 90 mg via INTRAVENOUS

## 2022-07-22 MED ORDER — OXYCODONE HCL 5 MG PO TABS: 5.0000 mg | ORAL_TABLET | Freq: Once | ORAL | Status: DC | PRN

## 2022-07-22 MED ORDER — EPHEDRINE 5 MG/ML INJ
INTRAVENOUS | Status: AC
  Filled 2022-07-22: qty 5

## 2022-07-22 MED ORDER — FENTANYL CITRATE (PF) 100 MCG/2ML IJ SOLN: 25.0000 ug | INTRAMUSCULAR | Status: DC | PRN

## 2022-07-22 MED ORDER — ACETAMINOPHEN 500 MG PO TABS
500.0000 mg | ORAL_TABLET | Freq: Four times a day (QID) | ORAL | Status: AC
  Administered 2022-07-22 – 2022-07-23 (×3): 500 mg via ORAL
  Filled 2022-07-22 (×3): qty 1

## 2022-07-22 MED ORDER — FENTANYL CITRATE (PF) 250 MCG/5ML IJ SOLN
INTRAMUSCULAR | Status: DC | PRN
  Administered 2022-07-22 (×2): 50 ug via INTRAVENOUS
  Administered 2022-07-22: 100 ug via INTRAVENOUS
  Administered 2022-07-22 (×2): 50 ug via INTRAVENOUS

## 2022-07-22 MED ORDER — ONDANSETRON HCL 4 MG PO TABS: 4.0000 mg | ORAL_TABLET | Freq: Four times a day (QID) | ORAL | Status: DC | PRN

## 2022-07-22 MED ORDER — SUCCINYLCHOLINE CHLORIDE 200 MG/10ML IV SOSY
PREFILLED_SYRINGE | INTRAVENOUS | Status: AC
  Filled 2022-07-22: qty 10

## 2022-07-22 MED ORDER — ACETAMINOPHEN 325 MG PO TABS: 325.0000 mg | ORAL_TABLET | Freq: Four times a day (QID) | ORAL | Status: DC | PRN

## 2022-07-22 MED ORDER — SENNA 8.6 MG PO TABS
1.0000 | ORAL_TABLET | Freq: Two times a day (BID) | ORAL | Status: DC
  Administered 2022-07-22 – 2022-07-23 (×2): 8.6 mg via ORAL
  Filled 2022-07-22 (×2): qty 1

## 2022-07-22 MED ORDER — METHOCARBAMOL 500 MG PO TABS: 500.0000 mg | ORAL_TABLET | Freq: Four times a day (QID) | ORAL | Status: DC | PRN

## 2022-07-22 MED ORDER — ONDANSETRON HCL 4 MG/2ML IJ SOLN
INTRAMUSCULAR | Status: AC
  Filled 2022-07-22: qty 2

## 2022-07-22 MED ORDER — PROPOFOL 10 MG/ML IV BOLUS
INTRAVENOUS | Status: AC
  Filled 2022-07-22: qty 20

## 2022-07-22 MED ORDER — ALBUTEROL SULFATE HFA 108 (90 BASE) MCG/ACT IN AERS
INHALATION_SPRAY | RESPIRATORY_TRACT | Status: DC | PRN
  Administered 2022-07-22: 6 via RESPIRATORY_TRACT

## 2022-07-22 MED ORDER — BUPIVACAINE HCL (PF) 0.5 % IJ SOLN
INTRAMUSCULAR | Status: AC
  Filled 2022-07-22: qty 30

## 2022-07-22 MED ORDER — METHOCARBAMOL 1000 MG/10ML IJ SOLN: 500.0000 mg | Freq: Four times a day (QID) | INTRAVENOUS | Status: DC | PRN

## 2022-07-22 MED ORDER — DEXAMETHASONE SODIUM PHOSPHATE 10 MG/ML IJ SOLN
INTRAMUSCULAR | Status: DC | PRN
  Administered 2022-07-22: 4 mg via INTRAVENOUS

## 2022-07-22 MED ORDER — PHENYLEPHRINE 80 MCG/ML (10ML) SYRINGE FOR IV PUSH (FOR BLOOD PRESSURE SUPPORT)
PREFILLED_SYRINGE | INTRAVENOUS | Status: AC
  Filled 2022-07-22: qty 10

## 2022-07-22 MED ORDER — DEXAMETHASONE SODIUM PHOSPHATE 10 MG/ML IJ SOLN
INTRAMUSCULAR | Status: AC
  Filled 2022-07-22: qty 1

## 2022-07-22 MED ORDER — VANCOMYCIN HCL 1500 MG/300ML IV SOLN
1500.0000 mg | INTRAVENOUS | Status: AC
  Administered 2022-07-22: 1500 mg via INTRAVENOUS
  Filled 2022-07-22: qty 300

## 2022-07-22 MED ORDER — LACTATED RINGERS IV SOLN: INTRAVENOUS | Status: DC

## 2022-07-22 MED ORDER — POVIDONE-IODINE 7.5 % EX SOLN
Freq: Once | CUTANEOUS | Status: DC
  Filled 2022-07-22: qty 118

## 2022-07-22 MED ORDER — MAGNESIUM CITRATE PO SOLN: 1.0000 | Freq: Once | ORAL | Status: DC | PRN

## 2022-07-22 MED ORDER — ORAL CARE MOUTH RINSE: 15.0000 mL | Freq: Once | OROMUCOSAL | Status: AC

## 2022-07-22 MED ORDER — ACETAMINOPHEN 500 MG PO TABS
ORAL_TABLET | ORAL | Status: AC
  Administered 2022-07-22: 1000 mg via ORAL
  Filled 2022-07-22: qty 2

## 2022-07-22 MED ORDER — DIPHENHYDRAMINE HCL 12.5 MG/5ML PO ELIX: 12.5000 mg | ORAL_SOLUTION | ORAL | Status: DC | PRN

## 2022-07-22 MED ORDER — KETOROLAC TROMETHAMINE 30 MG/ML IJ SOLN
INTRAMUSCULAR | Status: DC | PRN
  Administered 2022-07-22: 30 mg via INTRAVENOUS

## 2022-07-22 MED ORDER — POLYETHYLENE GLYCOL 3350 17 G PO PACK: 17.0000 g | PACK | Freq: Every day | ORAL | Status: DC | PRN

## 2022-07-22 MED ORDER — CHLORHEXIDINE GLUCONATE 0.12 % MT SOLN: 15.0000 mL | Freq: Once | OROMUCOSAL | Status: AC

## 2022-07-22 MED ORDER — MIDAZOLAM HCL 2 MG/2ML IJ SOLN
INTRAMUSCULAR | Status: AC
  Filled 2022-07-22: qty 2

## 2022-07-22 MED ORDER — ACETAMINOPHEN 500 MG PO TABS: 1000.0000 mg | ORAL_TABLET | Freq: Once | ORAL | Status: AC

## 2022-07-22 MED ORDER — FENTANYL CITRATE (PF) 250 MCG/5ML IJ SOLN
INTRAMUSCULAR | Status: AC
  Filled 2022-07-22: qty 5

## 2022-07-22 MED ORDER — METOCLOPRAMIDE HCL 5 MG/ML IJ SOLN: 5.0000 mg | Freq: Three times a day (TID) | INTRAMUSCULAR | Status: DC | PRN

## 2022-07-22 MED ORDER — ROCURONIUM BROMIDE 10 MG/ML (PF) SYRINGE
PREFILLED_SYRINGE | INTRAVENOUS | Status: AC
  Filled 2022-07-22: qty 10

## 2022-07-22 MED ORDER — OXYCODONE HCL 5 MG/5ML PO SOLN: 5.0000 mg | Freq: Once | ORAL | Status: DC | PRN

## 2022-07-22 SURGICAL SUPPLY — 29 items
BLADE CLIPPER SURG (BLADE) IMPLANT
BNDG CMPR MED 10X6 ELC LF (GAUZE/BANDAGES/DRESSINGS) ×1
BNDG ELASTIC 6X10 VLCR STRL LF (GAUZE/BANDAGES/DRESSINGS) IMPLANT
COVER SURGICAL LIGHT HANDLE (MISCELLANEOUS) ×2 IMPLANT
DRAPE ARTHROSCOPY W/POUCH 114 (DRAPES) ×2 IMPLANT
DRAPE U-SHAPE 47X51 STRL (DRAPES) ×2 IMPLANT
DRSG XEROFORM 1X8 (GAUZE/BANDAGES/DRESSINGS) IMPLANT
GAUZE PAD ABD 8X10 STRL (GAUZE/BANDAGES/DRESSINGS) IMPLANT
GAUZE SPONGE 4X4 12PLY STRL LF (GAUZE/BANDAGES/DRESSINGS) IMPLANT
GLOVE BIO SURGEON STRL SZ7 (GLOVE) ×2 IMPLANT
GLOVE BIO SURGEON STRL SZ7.5 (GLOVE) ×2 IMPLANT
GLOVE BIOGEL PI IND STRL 7.0 (GLOVE) ×2 IMPLANT
GLOVE BIOGEL PI IND STRL 8 (GLOVE) ×2 IMPLANT
GOWN STRL REUS W/ TWL LRG LVL3 (GOWN DISPOSABLE) ×4 IMPLANT
GOWN STRL REUS W/TWL LRG LVL3 (GOWN DISPOSABLE) ×4
IV NS IRRIG 3000ML ARTHROMATIC (IV SOLUTION) IMPLANT
KIT TURNOVER KIT B (KITS) ×2 IMPLANT
MANIFOLD NEPTUNE II (INSTRUMENTS) IMPLANT
NDL 22X1.5 STRL (OR ONLY) (MISCELLANEOUS) ×2 IMPLANT
NDL SPNL 18GX3.5 QUINCKE PK (NEEDLE) IMPLANT
NEEDLE 22X1.5 STRL (OR ONLY) (MISCELLANEOUS) ×1 IMPLANT
NEEDLE SPNL 18GX3.5 QUINCKE PK (NEEDLE) IMPLANT
NS IRRIG 1000ML POUR BTL (IV SOLUTION) IMPLANT
PACK ARTHROSCOPY DSU (CUSTOM PROCEDURE TRAY) ×2 IMPLANT
SPONGE T-LAP 4X18 ~~LOC~~+RFID (SPONGE) ×2 IMPLANT
SYR CONTROL 10ML LL (SYRINGE) IMPLANT
TOWEL GREEN STERILE (TOWEL DISPOSABLE) ×2 IMPLANT
TUBE CONNECTING 12X1/4 (SUCTIONS) ×2 IMPLANT
TUBING ARTHROSCOPY IRRIG 16FT (MISCELLANEOUS) ×2 IMPLANT

## 2022-07-22 NOTE — Anesthesia Procedure Notes (Signed)
Procedure Name: LMA Insertion Date/Time: 07/22/2022 11:15 AM  Performed by: Kayleen Memos, CRNAPre-anesthesia Checklist: Patient identified, Emergency Drugs available, Suction available, Patient being monitored and Timeout performed Patient Re-evaluated:Patient Re-evaluated prior to induction Oxygen Delivery Method: Circle system utilized Preoxygenation: Pre-oxygenation with 100% oxygen Induction Type: IV induction Ventilation: Oral airway inserted - appropriate to patient size LMA: LMA inserted LMA Size: 5.0 Number of attempts: 1 Placement Confirmation: positive ETCO2, CO2 detector and breath sounds checked- equal and bilateral Tube secured with: Tape Dental Injury: Teeth and Oropharynx as per pre-operative assessment

## 2022-07-22 NOTE — Progress Notes (Signed)
Pharmacy Antibiotic Note  Russell Shaw is a 57 y.o. male admitted on 07/22/2022 with  septic arthritis .  Pharmacy has been consulted for vanco dosing.  Plan: Vancomycin 1500 mg iv given in OR f/b 750 mg IV every 12 hours.  eAUC 461 mcg*hr/mL (scr 1.20 VD coef: 0.5 L/kg)  Height: 5\' 9"  (175.3 cm) Weight: 95.7 kg (211 lb) IBW/kg (Calculated) : 70.7  Temp (24hrs), Avg:97.6 F (36.4 C), Min:97.1 F (36.2 C), Max:98.1 F (36.7 C)  Recent Labs  Lab 07/22/22 0906 07/22/22 1518  WBC 9.3  --   CREATININE  --  1.20    Estimated Creatinine Clearance: 76.6 mL/min (by C-G formula based on SCr of 1.2 mg/dL).    No Known Allergies   Thank you for allowing pharmacy to be a part of this patient's care.  Greta Doom BS, PharmD, BCPS Clinical Pharmacist 07/22/2022 4:36 PM  Contact: [phone] after 3 PM  "Be curious, not judgmental..." -Debbora Dus

## 2022-07-22 NOTE — Anesthesia Preprocedure Evaluation (Signed)
Anesthesia Evaluation  Patient identified by MRN, date of birth, ID band Patient awake    Reviewed: Allergy & Precautions, H&P , NPO status , Patient's Chart, lab work & pertinent test results  Airway Mallampati: II   Neck ROM: full    Dental   Pulmonary Current Smoker   breath sounds clear to auscultation       Cardiovascular negative cardio ROS  Rhythm:regular Rate:Normal     Neuro/Psych    GI/Hepatic   Endo/Other    Renal/GU      Musculoskeletal   Abdominal   Peds  Hematology   Anesthesia Other Findings   Reproductive/Obstetrics                             Anesthesia Physical Anesthesia Plan  ASA: 2  Anesthesia Plan: General   Post-op Pain Management:    Induction: Intravenous  PONV Risk Score and Plan: 1 and Ondansetron, Dexamethasone, Midazolam and Treatment may vary due to age or medical condition  Airway Management Planned: LMA  Additional Equipment:   Intra-op Plan:   Post-operative Plan: Extubation in OR  Informed Consent: I have reviewed the patients History and Physical, chart, labs and discussed the procedure including the risks, benefits and alternatives for the proposed anesthesia with the patient or authorized representative who has indicated his/her understanding and acceptance.     Dental advisory given  Plan Discussed with: CRNA, Anesthesiologist and Surgeon  Anesthesia Plan Comments:        Anesthesia Quick Evaluation

## 2022-07-22 NOTE — Transfer of Care (Signed)
Immediate Anesthesia Transfer of Care Note  Patient: Russell Shaw  Procedure(s) Performed: ARTHROSCOPIC LAVAGE AND DRAINAGE LEFT KNEE (Left)  Patient Location: PACU  Anesthesia Type:General  Level of Consciousness: drowsy and responds to stimulation  Airway & Oxygen Therapy: Patient Spontanous Breathing and Patient connected to face mask oxygen  Post-op Assessment: Report given to RN, Post -op Vital signs reviewed and stable, Patient moving all extremities, and Patient moving all extremities X 4  Post vital signs: stable  Last Vitals:  Vitals Value Taken Time  BP 149/101 07/22/22 1203  Temp    Pulse 81 07/22/22 1213  Resp 11 07/22/22 1213  SpO2 98 % 07/22/22 1213  Vitals shown include unvalidated device data.  Last Pain:  Vitals:   07/22/22 0836  PainSc: 0-No pain      Patients Stated Pain Goal: 2 (07/22/22 0836)  Complications: No notable events documented.

## 2022-07-22 NOTE — Interval H&P Note (Signed)
History and Physical Interval Note:  07/22/2022 10:22 AM  Russell Shaw  has presented today for surgery, with the diagnosis of left knee infection.  The various methods of treatment have been discussed with the patient and family. After consideration of risks, benefits and other options for treatment, the patient has consented to  Procedure(s): ARTHROSCOPIC LAVAGE AND DRAINAGE LEFT KNEE (Left) as a surgical intervention.  The patient's history has been reviewed, patient examined, no change in status, stable for surgery.  I have reviewed the patient's chart and labs.  Questions were answered to the patient's satisfaction.     Eulas Post
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
## 2022-07-22 NOTE — Op Note (Signed)
07/22/2022  11:48 AM  PATIENT:  Russell Shaw    PRE-OPERATIVE DIAGNOSIS: Left knee septic arthritis, status post meniscectomy  POST-OPERATIVE DIAGNOSIS:  Same  PROCEDURE: Left knee arthroscopy with irrigation and lavage and synovectomy  SURGEON:  Eulas Post, MD  PHYSICIAN ASSISTANT: Janine Ores, PA-C, present and scrubbed throughout the case, critical for completion in a timely fashion, and for retraction, instrumentation, and closure.  ANESTHESIA:   General  PREOPERATIVE INDICATIONS:  [name] is a  57 y.o. male w who had a left knee arthroscopy performed approximately 2 weeks ago with Dr. Margarita Rana who presented with persistent drainage, swelling, redness, and aspirate from the office demonstrated staph.  Elected for urgent surgical management.  The risks benefits and alternatives were discussed with the patient preoperatively including but not limited to the risks of infection, bleeding, nerve injury, cardiopulmonary complications, the need for revision surgery, among others, and the patient was willing to proceed.  We also discussed the risks for persistent infection, the need for further interventions, incomplete relief of pain, the potential for postinfectious arthrosis, among others.  ESTIMATED BLOOD LOSS: Minimal  OPERATIVE IMPLANTS: None  OPERATIVE FINDINGS: Fibrinous synovitis throughout the knee, with purulence draining from the medial portal  OPERATIVE PROCEDURE: The patient was brought to the operating room and placed in supine position.  General anesthesia was administered.  Antibiotics were held until intra-articular cultures were taken.  The left lower extremity was prepped and draped in usual sterile fashion.  Timeout performed.  A incision was made through his previous portals, and a cannula introduced, and then I was able to remove 28 mL of purulent fluid, which was sent as a specimen for Gram stain, culture and sensitivity.  Diagnostic  arthroscopy carried out with the above named findings, the menisci appeared intact, after having been debrided at the previous operation.  The cartilaginous surfaces all looked reasonably good.  There was fibrinous material throughout with synovitis and injected erythematous synovium throughout.  I irrigated a total of 15,000 mL through all of the compartment, and also performed a synovectomy in each compartment, with lavage.  The instruments were removed, I debrided the medial portal which looked like the worst portal, left the portals open to drain, and dressed his knee with sterile gauze.  He was awakened and returned to the PACU in stable and satisfactory condition.  There were no complications and he tolerated the procedure well.  He will be admitted for PICC line placement, infectious disease consultation and arrangement for home health antibiotics.  Eulas Post, MD

## 2022-07-23 ENCOUNTER — Encounter (HOSPITAL_COMMUNITY): Payer: Self-pay | Admitting: Orthopedic Surgery

## 2022-07-23 DIAGNOSIS — M00062 Staphylococcal arthritis, left knee: Secondary | ICD-10-CM

## 2022-07-23 DIAGNOSIS — M009 Pyogenic arthritis, unspecified: Secondary | ICD-10-CM | POA: Diagnosis not present

## 2022-07-23 LAB — BASIC METABOLIC PANEL
Anion gap: 11 (ref 5–15)
BUN: 18 mg/dL (ref 6–20)
CO2: 22 mmol/L (ref 22–32)
Calcium: 8.5 mg/dL — ABNORMAL LOW (ref 8.9–10.3)
Chloride: 104 mmol/L (ref 98–111)
Creatinine, Ser: 0.78 mg/dL (ref 0.61–1.24)
GFR, Estimated: >60 mL/min (ref >60)
Glucose, Bld: 102 mg/dL — ABNORMAL HIGH (ref 70–99)
Potassium: 3.9 mmol/L (ref 3.5–5.1)
Sodium: 137 mmol/L (ref 135–145)

## 2022-07-23 LAB — CBC
HCT: 37.3 % — ABNORMAL LOW (ref 39.0–52.0)
Hemoglobin: 12.5 g/dL — ABNORMAL LOW (ref 13.0–17.0)
MCH: 30.7 pg (ref 26.0–34.0)
MCHC: 33.5 g/dL (ref 30.0–36.0)
MCV: 91.6 fL (ref 80.0–100.0)
Platelets: 316 10*3/uL (ref 150–400)
RBC: 4.07 MIL/uL — ABNORMAL LOW (ref 4.22–5.81)
RDW: 12.5 % (ref 11.5–15.5)
WBC: 12 10*3/uL — ABNORMAL HIGH (ref 4.0–10.5)
nRBC: 0 % (ref 0.0–0.2)

## 2022-07-23 MED ORDER — CHLORHEXIDINE GLUCONATE CLOTH 2 % EX PADS
6.0000 | MEDICATED_PAD | Freq: Every day | CUTANEOUS | Status: DC
  Administered 2022-07-23: 6 via TOPICAL

## 2022-07-23 MED ORDER — SODIUM CHLORIDE 0.9 % IV SOLN
700.0000 mg | Freq: Every day | INTRAVENOUS | Status: AC
  Administered 2022-07-23: 700 mg via INTRAVENOUS
  Filled 2022-07-23: qty 14

## 2022-07-23 MED ORDER — HEPARIN SOD (PORK) LOCK FLUSH 100 UNIT/ML IV SOLN
250.0000 [IU] | INTRAVENOUS | Status: AC | PRN
  Administered 2022-07-23: 250 [IU]

## 2022-07-23 MED ORDER — HYDROCODONE-ACETAMINOPHEN 10-325 MG PO TABS: 1.0000 | ORAL_TABLET | Freq: Four times a day (QID) | ORAL | 0 refills | Status: DC | PRN

## 2022-07-23 MED ORDER — SODIUM CHLORIDE 0.9% FLUSH
10.0000 mL | Freq: Two times a day (BID) | INTRAVENOUS | Status: DC
  Administered 2022-07-23: 10 mL

## 2022-07-23 MED ORDER — CEFAZOLIN IV (FOR PTA / DISCHARGE USE ONLY): 2.0000 g | Freq: Three times a day (TID) | INTRAVENOUS | 0 refills | Status: AC

## 2022-07-23 MED ORDER — VANCOMYCIN HCL IN DEXTROSE 1-5 GM/200ML-% IV SOLN: 1000.0000 mg | Freq: Two times a day (BID) | INTRAVENOUS | Status: DC

## 2022-07-23 MED ORDER — SODIUM CHLORIDE 0.9% FLUSH: 10.0000 mL | INTRAVENOUS | Status: DC | PRN

## 2022-07-23 MED ORDER — VANCOMYCIN IV (FOR PTA / DISCHARGE USE ONLY)
1000.0000 mg | Freq: Two times a day (BID) | INTRAVENOUS | 0 refills | Status: DC
Start: 2022-07-23 — End: 2022-07-23

## 2022-07-23 NOTE — TOC Transition Note (Signed)
Transition of Care [facility]) - CM/SW Discharge Note   Patient Details  Name: Russell Shaw MRN: [id number] Date of Birth: 07/31/1965  [facility] CM/SW Contact:  Epifanio Lesches, RN Phone Number: 07/23/2022, 4:16 PM   Clinical Narrative:    Patient will DC to: home Anticipated DC date: 07/23/2022 Family notified: yes Transport by: car        - s/p Left knee arthroscopy with irrigation and lavage and synovectomy / Septic arthritis. Per MD patient ready for DC today. RN, patient, and  patient's wife notified of DC.  Pt requiring LT IV ABX  home infusion.  Pt agreeable to home health services. Pt without provider preference. Referral made with Pam/[facility] accepted. [facility] will provide IV ABX therapy  and HHRN. Teaching completed with Pam for home infusion. Pt without RX med concerns. Post hospital f/u noted on AVS. Wife to provide transportation to home. RNCM will sign off for now as intervention is no longer needed. Please consult [place] again if new needs arise.    Final next level of care: Home w Home Health Services Barriers to Discharge: Continued Medical Work up   Patient Goals and CMS Choice   Choice offered to / list presented to : Patient  Discharge Placement                         Discharge Plan and Services Additional resources added to the After Visit Summary for     Discharge Planning Services: CM Consult            DME Arranged: Other see comment (IV ABX therapy)   Date DME Agency Contacted: 07/23/22 Time DME Agency Contacted: 1114 Representative spoke with at DME Agency: [facility] HH Arranged: RN          Social Determinants of Health (SDOH) Interventions SDOH Screenings   Food Insecurity: No Food Insecurity (07/22/2022)  Housing: Low Risk  (07/22/2022)  Transportation Needs: No Transportation Needs (07/22/2022)  Utilities: Not At Risk (07/22/2022)  Depression (PHQ2-9): Low Risk  (01/05/2021)  Tobacco Use: High  Risk (07/23/2022)     Readmission Risk Interventions     No data to display

## 2022-07-23 NOTE — Progress Notes (Signed)
PHARMACY CONSULT NOTE FOR:  OUTPATIENT  PARENTERAL ANTIBIOTIC THERAPY (OPAT)  Indication: MSSA septic R-knee  Regimen: Cefazolin 2 gm IV q 8 hours  End date: 08/23/22  IV antibiotic discharge orders are pended. To discharging provider:  please sign these orders via discharge navigator,  Select New Orders & click on the button choice - Manage This Unsigned Work.    Thank you for allowing pharmacy to be a part of this patient's care.  Sharin Mons, PharmD, BCPS, BCIDP Infectious Diseases Clinical Pharmacist Phone: [phone] 07/23/2022 3:45 PM   **Pharmacist phone directory can now be found on [email or web address] ([email or web address]).  Listed under [facility].

## 2022-07-23 NOTE — Discharge Summary (Signed)
Physician Discharge Summary  Patient ID: Russell Shaw MRN: [id number] DOB/AGE: 1965-11-11 57 y.o.  Admit date: 07/22/2022 Discharge date: 07/23/2022  Admission Diagnoses: left knee post-op infection  Discharge Diagnoses:  Principal Problem:   Post-operative infection   Discharged Condition: fair  Hospital Course: Patient underwent a left knee arthroscopic irrigation and debridement by Dr. Dion Saucier on 07/22/22 without complications. He spent the night in observation and had a PICC line placed today for IV antibiotics. He is ready to go home.   Consults: ID  Significant Diagnostic Studies: microbiology: wound culture: positive for staph aureus  Treatments: IV hydration, antibiotics: Ancef, vancomycin, and Daptomycin, analgesia: acetaminophen and Vicodin, therapies: PT, and surgery: left knee I&D  Discharge Exam: Blood pressure 110/73, pulse 62, temperature 98.7 F (37.1 C), temperature source Oral, resp. rate 16, height 5\' 9"  (1.753 m), weight 95.7 kg, SpO2 98 %. General appearance: alert, cooperative, and no distress Head: Normocephalic, without obvious abnormality, atraumatic Resp: clear to auscultation bilaterally Cardio: regular rate and rhythm Extremities: extremities normal, atraumatic, no cyanosis or edema Pulses:  L brachial 2+ R brachial 2+  L radial 2+ R radial 2+  L inguinal 2+ R inguinal 2+  L popliteal 2+ R popliteal 2+  L posterior tibial 2+ R posterior tibial 2+  L dorsalis pedis 2+ R dorsalis pedis 2+   Neurologic: Grossly normal Incision/Wound: c/d/I   Disposition: Discharge disposition: 06-Home-Health Care Svc       Discharge Instructions     [facility] pharmacist to adjust dose for Vancomycin, Aminoglycosides and other anti-infective therapies as requested by physician.   Complete by: As directed    [facility] to provide Cath Flo 2mg    Complete by: As directed    Administer for PICC line occlusion and as ordered by  physician for other access device issues.   Anaphylaxis Kit: Provided to treat any anaphylactic reaction to the medication being provided to the patient if First Dose or when requested by physician   Complete by: As directed    Epinephrine 1mg /ml vial / amp: Administer 0.3mg  (0.20ml) subcutaneously once for moderate to severe anaphylaxis, nurse to call physician and pharmacy when reaction occurs and call 911 if needed for immediate care   Diphenhydramine 50mg /ml IV vial: Administer 25-50mg  IV/IM PRN for first dose reaction, rash, itching, mild reaction, nurse to call physician and pharmacy when reaction occurs   Sodium Chloride 0.9% NS IV: Administer if needed for hypovolemic blood pressure drop or as ordered by physician after call to physician with anaphylactic reaction   Call MD / Call 911   Complete by: As directed    If you experience chest pain or shortness of breath, CALL 911 and be transported to the hospital emergency room.  If you develope a fever above 101 F, pus (white drainage) or increased drainage or redness at the wound, or calf pain, call your surgeon's office.   Change dressing on IV access line weekly and PRN   Complete by: As directed    Diet - low sodium heart healthy   Complete by: As directed    Discharge instructions   Complete by: As directed    Keep leg elevated with pillows and apply ice to reduce pain and swelling. It is not uncommon for the entire leg to be swollen and painful.   Diet: As you were doing prior to hospitalization   Dressing:  LEAVE DRESSINGS IN PLACE UNTIL OFFICE FOLLOW UP VISIT!! No submerging incisions in water in  a bath/pool/ocean. Continue to use ace wrap for compression to reduce swelling. A large amount of fluid is pumped through your knee during surgery to help with visualization. It is normal to have swelling and bruising due to this. Some of the fluid may leak out of the incisions and onto the dressings.    Activity:  Increase activity  slowly as tolerated, but follow the weight bearing instructions below.  The rules on driving is that you can not be taking narcotics while you drive, and you must feel in control of the vehicle.    Weight Bearing: As tolerated   Medicines:  - Tylenol is a pain medicine for mild to moderate pain. Try taking this before resorting to your narcotic pain medicine. - Norco/Vicodin is a narcotic pain medicine for severe pain.  - Zofran is for nausea and vomiting.  - Celebrex is for pain and inflammation.   To prevent constipation: you may use a stool softener such as -  Colace (over the counter) 100 mg by mouth twice a day  Drink plenty of fluids (prune juice may be helpful) and high fiber foods Miralax (over the counter) for constipation as needed.    Itching:  If you experience itching with your medications, try taking only a single pain pill, or even half a pain pill at a time.  You can also use benadryl over the counter for itching or also to help with sleep.   Precautions:  If you experience chest pain or shortness of breath - call 911 immediately for transfer to the hospital emergency department!!  If you develop a fever greater that 101 F, purulent drainage from wound, increased redness or drainage from wound, or calf pain -- Call the office at [phone]                                                 Follow- Up Appointment:  07/26/22 at 8:45am   Flush IV access with Sodium Chloride 0.9% and Heparin 10 units/ml or 100 units/ml   Complete by: As directed    Home infusion instructions - [facility]   Complete by: As directed    Instructions: Flush IV access with Sodium Chloride 0.9% and Heparin 10units/ml or 100units/ml   Change dressing on IV access line: Weekly and PRN   Instructions Cath Flo 2mg : Administer for PICC Line occlusion and as ordered by physician for other access device   [facility] pharmacist to adjust dose for: Vancomycin, Aminoglycosides and  other anti-infective therapies as requested by physician   Method of administration may be changed at the discretion of home infusion pharmacist based upon assessment of the patient and/or caregiver's ability to self-administer the medication ordered   Complete by: As directed    Post-operative opioid taper instructions:   Complete by: As directed    POST-OPERATIVE OPIOID TAPER INSTRUCTIONS: It is important to wean off of your opioid medication as soon as possible. If you do not need pain medication after your surgery it is ok to stop day one. Opioids include: Codeine, Hydrocodone(Norco, Vicodin), Oxycodone(Percocet, oxycontin) and hydromorphone amongst others.  Long term and even short term use of opiods can cause: Increased pain response Dependence Constipation Depression Respiratory depression And more.  Withdrawal symptoms can include Flu like symptoms Nausea, vomiting And more Techniques to manage these symptoms Hydrate well Eat  regular healthy meals Stay active Use relaxation techniques(deep breathing, meditating, yoga) Do Not substitute Alcohol to help with tapering If you have been on opioids for less than two weeks and do not have pain than it is ok to stop all together.  Plan to wean off of opioids This plan should start within one week post op of your joint replacement. Maintain the same interval or time between taking each dose and first decrease the dose.  Cut the total daily intake of opioids by one tablet each day Next start to increase the time between doses. The last dose that should be eliminated is the evening dose.      Weight bearing as tolerated   Complete by: As directed       Allergies as of 07/23/2022   No Known Allergies      Medication List     STOP taking these medications    ibuprofen 200 MG tablet Commonly known as: ADVIL       TAKE these medications    aspirin 81 MG chewable tablet Chew 81 mg by mouth daily. Swallow whole.    celecoxib 200 MG capsule Commonly known as: CELEBREX Take 200 mg by mouth 2 (two) times daily.   cephALEXin 500 MG capsule Commonly known as: KEFLEX Take 500 mg by mouth 4 (four) times daily.   HYDROcodone-acetaminophen 10-325 MG tablet Commonly known as: NORCO Take 1 tablet by mouth every 6 (six) hours as needed for severe pain. What changed: Another medication with the same name was added. Make sure you understand how and when to take each.   HYDROcodone-acetaminophen 10-325 MG tablet Commonly known as: Norco Take 1 tablet by mouth every 6 (six) hours as needed for severe pain. What changed: You were already taking a medication with the same name, and this prescription was added. Make sure you understand how and when to take each.   vancomycin  IVPB Inject 1,000 mg into the vein every 12 (twelve) hours. Indication:  Staph aureus septic knee First Dose: Yes Last Day of Therapy:  08/23/22 Labs - "Sunday/Monday:  CBC/D, BMP, and vancomycin trough. Labs - Thursday:  BMP and vancomycin trough Labs - Once weekly: ESR and CRP Pull PICC line after the completion of IV antibiotics Method of administration:Elastomeric Method of administration may be changed at the discretion of the patient and/or caregiver's ability to self-administer the medication ordered.               Discharge Care Instructions  (From admission, onward)           Start     Ordered   07/23/22 0000  Weight bearing as tolerated        07/23/22 1448   07/23/22 0000  Change dressing on IV access line weekly and PRN  (Home infusion instructions - [facility] )        06" /07/24 1435            Follow-up Information     Sheral Apley, MD. Go on 07/26/2022.   Specialty: Orthopedic Surgery Why: at 8:45am Contact information: [address] [place] [address] [phone]                 Signed: Marzetta Board 07/23/2022, 2:48 PM

## 2022-07-23 NOTE — Progress Notes (Signed)
PHARMACY CONSULT NOTE FOR:  OUTPATIENT  PARENTERAL ANTIBIOTIC THERAPY (OPAT)  Indication: Staph aureus septic R-knee (still pending sensitivities) Regimen: Vancomycin 1000 mg IV every 12 hours End date: 08/23/22  IV antibiotic discharge orders are pended. To discharging provider:  please sign these orders via discharge navigator,  Select New Orders & click on the button choice - Manage This Unsigned Work.    Thank you for allowing pharmacy to be a part of this patient's care.  Georgina Pillion, PharmD, BCPS Infectious Diseases Clinical Pharmacist 07/23/2022 11:08 AM   **Pharmacist phone directory can now be found on [email or web address] ([email or web address]).  Listed under [facility].
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
## 2022-07-23 NOTE — Progress Notes (Signed)
Subjective: Patient underwent a L knee arthroscopy with partial medial meniscectomy and patellofemoral chondroplasty on 07-08-22 by Dr. Eulah Pont. When I saw him in office for his first post-op visit 07-19-22, he was having a lot of pain, redness, swelling, and drainage from the knee. I aspirated fluid from the knee in an attempt to provide comfort. However, I did not like the appearance of the fluid aspirated. There were about 40cc of thick, cloudy, yellow fluid. I decided to send it to the lab and results came back with an elevated WBC of ~17k and "scant growth of Staph aureus". Susceptibilities are still pending. Since Dr. Eulah Pont was out of town, we had Dr. Dion Saucier take the patient back to the OR for a L knee washout.     Patient reports pain as minimal if any. Feels much better than before surgery. Tolerating diet.  Urinating.   No CP, SOB. Waiting on PICC line.   Objective:   VITALS:   Vitals:   07/22/22 1400 07/22/22 1439 07/23/22 0612 07/23/22 0734  BP: (!) 132/94 120/85 110/68 110/73  Pulse: 68 67 63 62  Resp: 16 16 16 16   Temp:  (!) 97.5 F (36.4 C) 98.3 F (36.8 C) 98.7 F (37.1 C)  TempSrc:  Oral Oral Oral  SpO2: 95% 96% 97% 98%  Weight:      Height:          Latest Ref Rng & Units 07/23/2022    8:05 AM 07/22/2022    9:06 AM  CBC  WBC 4.0 - 10.5 K/uL 12.0  9.3   Hemoglobin 13.0 - 17.0 g/dL 16.1  09.6   Hematocrit 39.0 - 52.0 % 37.3  42.1   Platelets 150 - 400 K/uL 316  308       Latest Ref Rng & Units 07/23/2022    8:05 AM 07/22/2022    3:18 PM  BMP  Glucose 70 - 99 mg/dL 045  409   BUN 6 - 20 mg/dL 18  26   Creatinine 8.11 - 1.24 mg/dL 9.14  7.82   Sodium 956 - 145 mmol/L 137  135   Potassium 3.5 - 5.1 mmol/L 3.9  3.9   Chloride 98 - 111 mmol/L 104  102   CO2 22 - 32 mmol/L 22  23   Calcium 8.9 - 10.3 mg/dL 8.5  8.7    Intake/Output      06/06 0701 06/07 0700 06/07 0701 06/08 0700   I.V. (mL/kg) 165.3 (1.7)    IV Piggyback 300    Total Intake(mL/kg) 465.3  (4.9)    Blood 5    Total Output 5    Net +460.3            Physical Exam: General: NAD. Laying in bed, calm, comfortable Resp: No increased wob Cardio: regular rate and rhythm ABD soft Neurologically intact MSK Neurovascularly intact Sensation intact distally Intact pulses distally Dorsiflexion/Plantar flexion intact Incision: dressing C/D/I   Assessment: 1 Day Post-Op  S/P Procedure(s) (LRB): ARTHROSCOPIC LAVAGE AND DRAINAGE LEFT KNEE (Left) by Dr. Dion Saucier on 07/22/22  Principal Problem:   Post-operative infection   Plan: Awaiting PICC line placement for IV ABX. Appears note from ID mentions 4 weeks of tx Advance diet Up with therapy Incentive Spirometry Elevate and Apply ice  Weightbearing: WBAT LLE Insicional and dressing care: Dressings left intact until follow-up and Reinforce dressings as needed Orthopedic device(s): None Showering: Keep dressing dry VTE prophylaxis: Aspirin 81mg  BID  for 30 days since  surgery on 07/08/22 , SCDs, ambulation Pain control: PRN Follow - up plan:  in office on Monday with Dr. Adora Fridge information:  Margarita Rana MD, Levester Fresh PA-C  Dispo: Home once PICC line placed and ID orders for [facility] are placed.   Contact me when ready for discharge please.   Jenne Pane, PA-C Office [phone] 07/23/2022, 9:24 AM

## 2022-07-23 NOTE — Progress Notes (Addendum)
[facility]  Date of Admission:  07/22/2022     Total days of antibiotics 2         ASSESSMENT:  [name] is POD #1 from left knee arthroscopy with I&D and synovectomy for left knee septic arthritis with aspiration cultures showing Staphylococcus aureus. I checked with Dr. Shelba Shaw office and sensitivities remain pending. Surgical specimens from 6/6 are without organisms on gram stain and culture incubated for better growth. Reviewed plan of care for 4 weeks of antibiotics from date of surgery. Place PICC line. Continue post-operative wound care per Dr. Dion Shaw.   ADDENDUM: Culture results from Orthopedics office show MSSA and will treat  with Cefazolin.   PLAN:  Change antibiotics to Cefazolin.  Monitor cultures and check on sensitivities and narrow antibiotics as appropriate.  Insert PICC line for home antibiotic administration.  Therapeutic drug monitoring of renal function and vancomycin levels.  OPAT/Home Health Post-operative wound care per Dr. Dion Shaw.   I have personally spent 28 minutes involved in face-to-face and non-face-to-face activities for this patient on the day of the visit. Professional time spent includes the following activities: Preparing to see the patient (review of tests), Obtaining and/or reviewing separately obtained history (admission/discharge record), Performing a medically appropriate examination and/or evaluation , Ordering medications/tests/procedures, referring and communicating with other health care professionals, Documenting clinical information in the EMR, Independently interpreting results (not separately reported), Communicating results to the patient/family/caregiver, Counseling and educating the patient/family/caregiver and Care coordination (not separately reported).   Diagnosis: Septic Arthritis   Culture Result: MSSA  No Known Allergies  OPAT Orders Discharge antibiotics to be given via PICC line Discharge  antibiotics: Cefazolin  Per pharmacy protocol   Duration: 4 weeks  End Date: 08/23/22  [facility] Per Protocol:  Home health RN for IV administration and teaching; PICC line care and labs.    Labs weekly while on IV antibiotics: _X_ CBC with differential _X BMP __ CMP _X_ CRP _X_ ESR __ Vancomycin trough __ CK  _X_ Please pull PIC at completion of IV antibiotics __ Please leave PIC in place until doctor has seen patient or been notified  Fax weekly labs to [phone]  Clinic Follow Up Appt:  07/30/22 at 9:15am with Russell Shaw  ------------------------------------------ I have personally spent 60 minutes involved in face-to-face and non-face-to-face activities for this patient on the day of the visit. Professional time spent includes the following activities: Preparing to see the patient (review of tests), Obtaining and/or reviewing separately obtained history (admission/discharge record), Performing a medically appropriate examination and/or evaluation , Ordering medications/tests/procedures, referring and communicating with other health care professionals, Documenting clinical information in the EMR, Independently interpreting results (not separately reported), Communicating results to the patient/family/caregiver, Counseling and educating the patient/family/caregiver and Care coordination (not separately reported).    --------------------------------------------------------------------- Principal Problem:   Post-operative infection    acetaminophen  500 mg Oral Q6H   docusate sodium  100 mg Oral BID   senna  1 tablet Oral BID    SUBJECTIVE:  Afebrile overnight with no acute events. No new concerns/complaints.   No Known Allergies   Review of Systems: Review of Systems  Constitutional:  Negative for chills, fever and weight loss.  Respiratory:  Negative for cough, shortness of breath and wheezing.   Cardiovascular:  Negative for chest pain and leg swelling.   Gastrointestinal:  Negative for abdominal pain, constipation, diarrhea, nausea and vomiting.  Skin:  Negative for rash.  OBJECTIVE: Vitals:   07/22/22 1400 07/22/22 1439 07/23/22 0612 07/23/22 0734  BP: (!) 132/94 120/85 110/68 110/73  Pulse: 68 67 63 62  Resp: 16 16 16 16   Temp:  (!) 97.5 F (36.4 C) 98.3 F (36.8 C) 98.7 F (37.1 C)  TempSrc:  Oral Oral Oral  SpO2: 95% 96% 97% 98%  Weight:      Height:       Body mass index is 31.16 kg/m.  Physical Exam Constitutional:      General: He is not in acute distress.    Appearance: He is well-developed.  Cardiovascular:     Rate and Rhythm: Normal rate and regular rhythm.     Heart sounds: Normal heart sounds.  Pulmonary:     Effort: Pulmonary effort is normal.     Breath sounds: Normal breath sounds.  Skin:    General: Skin is warm and dry.  Neurological:     Mental Status: He is alert.  Psychiatric:        Mood and Affect: Mood normal.     Lab Results Lab Results  Component Value Date   WBC 12.0 (H) 07/23/2022   HGB 12.5 (L) 07/23/2022   HCT 37.3 (L) 07/23/2022   MCV 91.6 07/23/2022   PLT 316 07/23/2022    Lab Results  Component Value Date   CREATININE 0.78 07/23/2022   BUN 18 07/23/2022   NA 137 07/23/2022   K 3.9 07/23/2022   CL 104 07/23/2022   CO2 22 07/23/2022   No results found for: "ALT", "AST", "GGT", "ALKPHOS", "BILITOT"   Microbiology: Recent Results (from the past 240 hour(s))  Aerobic/Anaerobic Culture w Gram Stain (surgical/deep wound)     Status: None (Preliminary result)   Collection Time: 07/22/22 11:27 AM   Specimen: PATH Cytology Misc. fluid; Body Fluid  Result Value Ref Range Status   Specimen Description FLUID DRAINAGE LEFT KNEE  Final   Special Requests A  Final   Gram Stain   Final    MODERATE WBC PRESENT, PREDOMINANTLY PMN RARE SQUAMOUS EPITHELIAL CELLS PRESENT NO ORGANISMS SEEN    Culture   Final    CULTURE REINCUBATED FOR BETTER GROWTH Performed at [facility], [address], [place], [place] [address]    Report Status PENDING  Incomplete  Surgical pcr screen     Status: Abnormal   Collection Time: 07/22/22  4:45 PM   Specimen: Nasal Mucosa; Nasal Swab  Result Value Ref Range Status   MRSA, PCR NEGATIVE NEGATIVE Final   Staphylococcus aureus POSITIVE (A) NEGATIVE Final    Comment: (NOTE) The Xpert SA Assay (FDA approved for NASAL specimens in patients 46 years of age and older), is one component of a comprehensive surveillance program. It is not intended to diagnose infection nor to guide or monitor treatment. Performed at [facility], [address], [place], [place] [address]      Russell Eke, NP [facility]  07/23/2022  9:34 AM

## 2022-07-23 NOTE — Plan of Care (Signed)

## 2022-07-23 NOTE — Progress Notes (Signed)
Discharge instructions given. Patient verbalized understanding and all questions were answered.  ?

## 2022-07-23 NOTE — Progress Notes (Signed)
    Results from Left knee aspiration taken 07/19/22 in the office        Marzetta Board Office [phone] 07/23/2022, 3:02 PM

## 2022-07-23 NOTE — Progress Notes (Signed)
Peripherally Inserted Central Catheter Placement  The IV Nurse has discussed with the patient and/or persons authorized to consent for the patient, the purpose of this procedure and the potential benefits and risks involved with this procedure.  The benefits include less needle sticks, lab draws from the catheter, and the patient may be discharged home with the catheter. Risks include, but not limited to, infection, bleeding, blood clot (thrombus formation), and puncture of an artery; nerve damage and irregular heartbeat and possibility to perform a PICC exchange if needed/ordered by physician.  Alternatives to this procedure were also discussed.  Bard Power PICC patient education guide, fact sheet on infection prevention and patient information card has been provided to patient /or left at bedside.    PICC Placement Documentation  PICC Single Lumen 07/23/22 Right Basilic 0 cm 41 cm (Active)  Indication for Insertion or Continuance of Line Home intravenous therapies (PICC only) 07/23/22 1045  Exposed Catheter (cm) 0 cm 07/23/22 1045  Site Assessment Clean, Dry, Intact 07/23/22 1045  Line Status Flushed;Saline locked;Blood return noted 07/23/22 1045  Dressing Type Transparent;Securing device 07/23/22 1045  Dressing Status Antimicrobial disc in place 07/23/22 1045  Safety Lock Not Applicable 07/23/22 1045  Line Care Connections checked and tightened 07/23/22 1045  Line Adjustment (NICU/IV Team Only) No 07/23/22 1045  Dressing Intervention New dressing 07/23/22 1045  Dressing Change Due 07/30/22 07/23/22 1045       Louay Myrie 07/23/2022, 10:47 AM

## 2022-07-24 LAB — AEROBIC/ANAEROBIC CULTURE W GRAM STAIN (SURGICAL/DEEP WOUND)

## 2022-07-26 ENCOUNTER — Encounter (HOSPITAL_BASED_OUTPATIENT_CLINIC_OR_DEPARTMENT_OTHER): Payer: Self-pay | Admitting: Orthopedic Surgery

## 2022-07-26 LAB — AEROBIC/ANAEROBIC CULTURE W GRAM STAIN (SURGICAL/DEEP WOUND)

## 2022-07-26 NOTE — Anesthesia Postprocedure Evaluation (Signed)
Anesthesia Post Note  Patient: Russell Shaw  Procedure(s) Performed: ARTHROSCOPIC LAVAGE AND DRAINAGE LEFT KNEE (Left)     Patient location during evaluation: PACU Anesthesia Type: General Level of consciousness: awake and alert Pain management: pain level controlled Vital Signs Assessment: post-procedure vital signs reviewed and stable Respiratory status: spontaneous breathing, nonlabored ventilation, respiratory function stable and patient connected to nasal cannula oxygen Cardiovascular status: blood pressure returned to baseline and stable Postop Assessment: no apparent nausea or vomiting Anesthetic complications: no   No notable events documented.  Last Vitals:  Vitals:   07/23/22 0612 07/23/22 0734  BP: 110/68 110/73  Pulse: 63 62  Resp: 16 16  Temp: 36.8 C 37.1 C  SpO2: 97% 98%    Last Pain:  Vitals:   07/23/22 0740  TempSrc:   PainSc: 0-No pain                 Anyae Griffith S
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
## 2022-07-27 ENCOUNTER — Other Ambulatory Visit: Payer: Self-pay

## 2022-07-27 ENCOUNTER — Encounter (HOSPITAL_BASED_OUTPATIENT_CLINIC_OR_DEPARTMENT_OTHER)
Admission: RE | Disposition: A | Discharge status: Home / Self Care | Payer: Self-pay | Source: Home / Self Care | Attending: Orthopedic Surgery

## 2022-07-27 ENCOUNTER — Ambulatory Visit (HOSPITAL_BASED_OUTPATIENT_CLINIC_OR_DEPARTMENT_OTHER): Payer: 59 | Admitting: Anesthesiology

## 2022-07-27 ENCOUNTER — Ambulatory Visit (HOSPITAL_BASED_OUTPATIENT_CLINIC_OR_DEPARTMENT_OTHER)
Admission: RE | Admit: 2022-07-27 | Discharge: 2022-07-27 | Disposition: A | Discharge status: Home / Self Care | Payer: 59 | Attending: Orthopedic Surgery | Admitting: Orthopedic Surgery

## 2022-07-27 ENCOUNTER — Encounter (HOSPITAL_BASED_OUTPATIENT_CLINIC_OR_DEPARTMENT_OTHER): Payer: Self-pay | Admitting: Orthopedic Surgery

## 2022-07-27 DIAGNOSIS — T8140XA Infection following a procedure, unspecified, initial encounter: Secondary | ICD-10-CM | POA: Diagnosis not present

## 2022-07-27 DIAGNOSIS — F17210 Nicotine dependence, cigarettes, uncomplicated: Secondary | ICD-10-CM | POA: Insufficient documentation

## 2022-07-27 DIAGNOSIS — X58XXXA Exposure to other specified factors, initial encounter: Secondary | ICD-10-CM | POA: Insufficient documentation

## 2022-07-27 DIAGNOSIS — M009 Pyogenic arthritis, unspecified: Secondary | ICD-10-CM | POA: Diagnosis not present

## 2022-07-27 DIAGNOSIS — T8149XA Infection following a procedure, other surgical site, initial encounter: Secondary | ICD-10-CM | POA: Insufficient documentation

## 2022-07-27 DIAGNOSIS — M00062 Staphylococcal arthritis, left knee: Secondary | ICD-10-CM | POA: Diagnosis present

## 2022-07-27 HISTORY — PX: INCISION AND DRAINAGE ABSCESS: SHX5864

## 2022-07-27 LAB — AEROBIC/ANAEROBIC CULTURE W GRAM STAIN (SURGICAL/DEEP WOUND)

## 2022-07-27 SURGERY — INCISION AND DRAINAGE, ABSCESS
Anesthesia: General | Site: Knee | Laterality: Left

## 2022-07-27 MED ORDER — AMISULPRIDE (ANTIEMETIC) 5 MG/2ML IV SOLN: 10.0000 mg | Freq: Once | INTRAVENOUS | Status: DC | PRN

## 2022-07-27 MED ORDER — OXYCODONE HCL 5 MG PO TABS
ORAL_TABLET | ORAL | Status: AC
  Filled 2022-07-27: qty 1

## 2022-07-27 MED ORDER — ACETAMINOPHEN 500 MG PO TABS
ORAL_TABLET | ORAL | Status: AC
  Filled 2022-07-27: qty 2

## 2022-07-27 MED ORDER — DEXAMETHASONE SODIUM PHOSPHATE 10 MG/ML IJ SOLN
INTRAMUSCULAR | Status: AC
  Filled 2022-07-27: qty 1

## 2022-07-27 MED ORDER — ACETAMINOPHEN 10 MG/ML IV SOLN: 1000.0000 mg | Freq: Once | INTRAVENOUS | Status: DC | PRN

## 2022-07-27 MED ORDER — FENTANYL CITRATE (PF) 100 MCG/2ML IJ SOLN
INTRAMUSCULAR | Status: AC
  Filled 2022-07-27: qty 2

## 2022-07-27 MED ORDER — HYDROMORPHONE HCL 1 MG/ML IJ SOLN
INTRAMUSCULAR | Status: AC
  Filled 2022-07-27: qty 0.5

## 2022-07-27 MED ORDER — ONDANSETRON HCL 4 MG/2ML IJ SOLN: 4.0000 mg | Freq: Once | INTRAMUSCULAR | Status: DC | PRN

## 2022-07-27 MED ORDER — ACETAMINOPHEN 500 MG PO TABS
1000.0000 mg | ORAL_TABLET | Freq: Once | ORAL | Status: AC
  Administered 2022-07-27: 1000 mg via ORAL

## 2022-07-27 MED ORDER — CEFAZOLIN SODIUM-DEXTROSE 2-4 GM/100ML-% IV SOLN
INTRAVENOUS | Status: AC
  Filled 2022-07-27: qty 100

## 2022-07-27 MED ORDER — DEXAMETHASONE SODIUM PHOSPHATE 4 MG/ML IJ SOLN
INTRAMUSCULAR | Status: DC | PRN
  Administered 2022-07-27: 8 mg via INTRAVENOUS

## 2022-07-27 MED ORDER — DEXMEDETOMIDINE HCL IN NACL 80 MCG/20ML IV SOLN
INTRAVENOUS | Status: AC
  Filled 2022-07-27: qty 40

## 2022-07-27 MED ORDER — DEXMEDETOMIDINE HCL IN NACL 80 MCG/20ML IV SOLN
INTRAVENOUS | Status: DC | PRN
  Administered 2022-07-27: 4 ug via INTRAVENOUS
  Administered 2022-07-27 (×2): 8 ug via INTRAVENOUS

## 2022-07-27 MED ORDER — SODIUM CHLORIDE 0.9 % IR SOLN
Status: DC | PRN
  Administered 2022-07-27: 9000 mL

## 2022-07-27 MED ORDER — LIDOCAINE 2% (20 MG/ML) 5 ML SYRINGE
INTRAMUSCULAR | Status: AC
  Filled 2022-07-27: qty 5

## 2022-07-27 MED ORDER — MIDAZOLAM HCL 2 MG/2ML IJ SOLN
INTRAMUSCULAR | Status: AC
  Filled 2022-07-27: qty 2

## 2022-07-27 MED ORDER — ACETAMINOPHEN 10 MG/ML IV SOLN: INTRAVENOUS | Status: DC | PRN

## 2022-07-27 MED ORDER — HYDROMORPHONE HCL 1 MG/ML IJ SOLN
0.2500 mg | INTRAMUSCULAR | Status: DC | PRN
  Administered 2022-07-27 (×2): 0.5 mg via INTRAVENOUS

## 2022-07-27 MED ORDER — DEXAMETHASONE SODIUM PHOSPHATE 10 MG/ML IJ SOLN: 8.0000 mg | Freq: Once | INTRAMUSCULAR | Status: DC

## 2022-07-27 MED ORDER — FENTANYL CITRATE (PF) 100 MCG/2ML IJ SOLN
INTRAMUSCULAR | Status: DC | PRN
  Administered 2022-07-27 (×2): 50 ug via INTRAVENOUS

## 2022-07-27 MED ORDER — MIDAZOLAM HCL 5 MG/5ML IJ SOLN
INTRAMUSCULAR | Status: DC | PRN
  Administered 2022-07-27: 2 mg via INTRAVENOUS

## 2022-07-27 MED ORDER — PROPOFOL 10 MG/ML IV BOLUS
INTRAVENOUS | Status: DC | PRN
  Administered 2022-07-27: 180 mg via INTRAVENOUS

## 2022-07-27 MED ORDER — POVIDONE-IODINE 10 % EX SWAB: 2.0000 | Freq: Once | CUTANEOUS | Status: DC

## 2022-07-27 MED ORDER — ONDANSETRON HCL 4 MG/2ML IJ SOLN
INTRAMUSCULAR | Status: DC | PRN
  Administered 2022-07-27: 4 mg via INTRAVENOUS

## 2022-07-27 MED ORDER — ACETAMINOPHEN 10 MG/ML IV SOLN
INTRAVENOUS | Status: AC
  Filled 2022-07-27: qty 100

## 2022-07-27 MED ORDER — ONDANSETRON HCL 4 MG/2ML IJ SOLN
INTRAMUSCULAR | Status: AC
  Filled 2022-07-27: qty 2

## 2022-07-27 MED ORDER — CHLORHEXIDINE GLUCONATE 4 % EX SOLN: 60.0000 mL | Freq: Once | CUTANEOUS | Status: DC

## 2022-07-27 MED ORDER — CEFAZOLIN SODIUM-DEXTROSE 2-4 GM/100ML-% IV SOLN
2.0000 g | INTRAVENOUS | Status: AC
  Administered 2022-07-27: 2 g via INTRAVENOUS

## 2022-07-27 MED ORDER — OXYCODONE HCL 5 MG PO TABS: 5.0000 mg | ORAL_TABLET | Freq: Once | ORAL | Status: DC | PRN

## 2022-07-27 MED ORDER — PROPOFOL 10 MG/ML IV BOLUS
INTRAVENOUS | Status: AC
  Filled 2022-07-27: qty 20

## 2022-07-27 MED ORDER — LIDOCAINE 2% (20 MG/ML) 5 ML SYRINGE
INTRAMUSCULAR | Status: DC | PRN
  Administered 2022-07-27: 100 mg via INTRAVENOUS

## 2022-07-27 MED ORDER — LACTATED RINGERS IV SOLN: INTRAVENOUS | Status: DC

## 2022-07-27 MED ORDER — OXYCODONE HCL 5 MG/5ML PO SOLN: 5.0000 mg | Freq: Once | ORAL | Status: DC | PRN

## 2022-07-27 SURGICAL SUPPLY — 38 items
APL PRP STRL LF DISP 70% ISPRP (MISCELLANEOUS) ×1
BNDG CMPR 5X62 HK CLSR LF (GAUZE/BANDAGES/DRESSINGS) ×1
BNDG CMPR 6"X 5 YARDS HK CLSR (GAUZE/BANDAGES/DRESSINGS) ×1
BNDG ELASTIC 6INX 5YD STR LF (GAUZE/BANDAGES/DRESSINGS) ×2 IMPLANT
CHLORAPREP W/TINT 26 (MISCELLANEOUS) ×2 IMPLANT
CUFF TOURN SGL QUICK 34 (TOURNIQUET CUFF) ×1
CUFF TRNQT CYL 34X4.125X (TOURNIQUET CUFF) ×2 IMPLANT
DISSECTOR 3.8MM X 13CM (MISCELLANEOUS) ×2 IMPLANT
DISSECTOR 4.0MM X 13CM (MISCELLANEOUS) IMPLANT
DRAPE ARTHROSCOPY W/POUCH 90 (DRAPES) ×2 IMPLANT
DRAPE IMP U-DRAPE 54X76 (DRAPES) ×2 IMPLANT
DRAPE U-SHAPE 47X51 STRL (DRAPES) ×2 IMPLANT
DRSG EMULSION OIL 3X3 NADH (GAUZE/BANDAGES/DRESSINGS) ×2 IMPLANT
EVACUATOR 1/8 PVC DRAIN (DRAIN) IMPLANT
EXCALIBUR 3.8MM X 13CM (MISCELLANEOUS) IMPLANT
GAUZE SPONGE 4X4 12PLY STRL (GAUZE/BANDAGES/DRESSINGS) ×2 IMPLANT
GLOVE BIO SURGEON STRL SZ7.5 (GLOVE) ×2 IMPLANT
GLOVE BIOGEL PI IND STRL 7.5 (GLOVE) ×4 IMPLANT
GLOVE BIOGEL PI IND STRL 8 (GLOVE) ×2 IMPLANT
GLOVE BIOGEL PI IND STRL 8.5 (GLOVE) IMPLANT
GLOVE SURG SYN 7.5 E (GLOVE) ×1 IMPLANT
GLOVE SURG SYN 7.5 PF PI (GLOVE) ×2 IMPLANT
GOWN STRL REUS W/ TWL LRG LVL3 (GOWN DISPOSABLE) ×6 IMPLANT
GOWN STRL REUS W/ TWL XL LVL3 (GOWN DISPOSABLE) ×2 IMPLANT
GOWN STRL REUS W/TWL LRG LVL3 (GOWN DISPOSABLE) ×3
GOWN STRL REUS W/TWL XL LVL3 (GOWN DISPOSABLE) ×1
MANIFOLD NEPTUNE II (INSTRUMENTS) ×2 IMPLANT
PACK ARTHROSCOPY DSU (CUSTOM PROCEDURE TRAY) ×2 IMPLANT
PACK BASIN DAY SURGERY FS (CUSTOM PROCEDURE TRAY) ×2 IMPLANT
PACK ICE MAXI GEL EZY WRAP (MISCELLANEOUS) ×2 IMPLANT
PORT APPOLLO RF 90DEGREE MULTI (SURGICAL WAND) IMPLANT
SLEEVE SCD COMPRESS KNEE MED (STOCKING) ×2 IMPLANT
SUT ETHILON 3 0 PS 1 (SUTURE) ×2 IMPLANT
TAPE CLOTH 3X10 TAN LF (GAUZE/BANDAGES/DRESSINGS) IMPLANT
TOWEL GREEN STERILE FF (TOWEL DISPOSABLE) ×2 IMPLANT
TUBING ARTHROSCOPY IRRIG 16FT (MISCELLANEOUS) ×2 IMPLANT
WATER STERILE IRR 1000ML POUR (IV SOLUTION) ×2 IMPLANT
WRAP KNEE MAXI GEL POST OP (GAUZE/BANDAGES/DRESSINGS) ×2 IMPLANT

## 2022-07-27 NOTE — Discharge Instructions (Addendum)
POST-OPERATIVE OPIOID TAPER INSTRUCTIONS: It is important to wean off of your opioid medication as soon as possible. If you do not need pain medication after your surgery it is ok to stop day one. Opioids include: Codeine, Hydrocodone(Norco, Vicodin), Oxycodone(Percocet, oxycontin) and hydromorphone amongst others.  Long term and even short term use of opiods can cause: Increased pain response Dependence Constipation Depression Respiratory depression And more.  Withdrawal symptoms can include Flu like symptoms Nausea, vomiting And more Techniques to manage these symptoms Hydrate well Eat regular healthy meals Stay active Use relaxation techniques(deep breathing, meditating, yoga) Do Not substitute Alcohol to help with tapering If you have been on opioids for less than two weeks and do not have pain than it is ok to stop all together.  Plan to wean off of opioids This plan should start within one week post op of your joint replacement. Maintain the same interval or time between taking each dose and first decrease the dose.  Cut the total daily intake of opioids by one tablet each day Next start to increase the time between doses. The last dose that should be eliminated is the evening dose.    May take Tylenol after 7pm, if needed.    Post Anesthesia Home Care Instructions  Activity: Get plenty of rest for the remainder of the day. A responsible individual must stay with you for 24 hours following the procedure.  For the next 24 hours, DO NOT: -Drive a car -Advertising copywriter -Drink alcoholic beverages -Take any medication unless instructed by your physician -Make any legal decisions or sign important papers.  Meals: Start with liquid foods such as gelatin or soup. Progress to regular foods as tolerated. Avoid greasy, spicy, heavy foods. If nausea and/or vomiting occur, drink only clear liquids until the nausea and/or vomiting subsides. Call your physician if vomiting  continues.  Special Instructions/Symptoms: Your throat may feel dry or sore from the anesthesia or the breathing tube placed in your throat during surgery. If this causes discomfort, gargle with warm salt water. The discomfort should disappear within 24 hours.  If you had a scopolamine patch placed behind your ear for the management of post- operative nausea and/or vomiting:  1. The medication in the patch is effective for 72 hours, after which it should be removed.  Wrap patch in a tissue and discard in the trash. Wash hands thoroughly with soap and water. 2. You may remove the patch earlier than 72 hours if you experience unpleasant side effects which may include dry mouth, dizziness or visual disturbances. 3. Avoid touching the patch. Wash your hands with soap and water after contact with the patch.

## 2022-07-27 NOTE — Transfer of Care (Signed)
Immediate Anesthesia Transfer of Care Note  Patient: Russell Shaw  Procedure(s) Performed: INCISION AND DRAINAGE ABSCESS (Left: Knee)  Patient Location: PACU  Anesthesia Type:Regional  Level of Consciousness: drowsy  Airway & Oxygen Therapy: Patient Spontanous Breathing and Patient connected to face mask oxygen  Post-op Assessment: Report given to RN and Post -op Vital signs reviewed and stable  Post vital signs: Reviewed and stable  Last Vitals:  Vitals Value Taken Time  BP 134/91   Temp    Pulse 80 07/27/22 1643  Resp 18   SpO2 91 % 07/27/22 1643  Vitals shown include unvalidated device data.  Last Pain:  Vitals:   07/27/22 1308  TempSrc: Oral  PainSc: 5          Complications: No notable events documented.

## 2022-07-27 NOTE — Anesthesia Postprocedure Evaluation (Signed)
Anesthesia Post Note  Patient: Russell Shaw  Procedure(s) Performed: INCISION AND DRAINAGE ABSCESS (Left: Knee)     Patient location during evaluation: PACU Anesthesia Type: General Level of consciousness: awake and alert Pain management: pain level controlled Vital Signs Assessment: post-procedure vital signs reviewed and stable Respiratory status: spontaneous breathing, nonlabored ventilation, respiratory function stable and patient connected to nasal cannula oxygen Cardiovascular status: blood pressure returned to baseline and stable Postop Assessment: no apparent nausea or vomiting Anesthetic complications: no   No notable events documented.  Last Vitals:  Vitals:   07/27/22 1723 07/27/22 1724  BP:    Pulse: 84 84  Resp: 12   Temp:    SpO2: 94% 93%    Last Pain:  Vitals:   07/27/22 1712  TempSrc:   PainSc: Asleep                 Trevor Iha

## 2022-07-27 NOTE — Anesthesia Preprocedure Evaluation (Addendum)
Anesthesia Evaluation  Patient identified by MRN, date of birth, ID band Patient awake    Reviewed: Allergy & Precautions, NPO status , Patient's Chart, lab work & pertinent test results  Airway Mallampati: II  TM Distance: >3 FB Neck ROM: Full    Dental no notable dental hx. (+) Teeth Intact, Dental Advisory Given   Pulmonary Current SmokerPatient did not abstain from smoking.   Pulmonary exam normal breath sounds clear to auscultation       Cardiovascular negative cardio ROS Normal cardiovascular exam Rhythm:Regular Rate:Normal     Neuro/Psych negative neurological ROS     GI/Hepatic negative GI ROS,,,(+)     substance abuse  alcohol use  Endo/Other    Renal/GU Lab Results      Component                Value               Date                      CREATININE               0.78                07/23/2022                BUN                      18                  07/23/2022                NA                       137                 07/23/2022                K                        3.9                 07/23/2022                CL                       104                 07/23/2022                CO2                      22                  07/23/2022                Musculoskeletal  (+) Arthritis ,    Abdominal   Peds  Hematology Lab Results      Component                Value               Date                      WBC  12.0 (H)            07/23/2022                HGB                      12.5 (L)            07/23/2022                HCT                      37.3 (L)            07/23/2022                MCV                      91.6                07/23/2022                PLT                      316                 07/23/2022              Anesthesia Other Findings   Reproductive/Obstetrics                             Anesthesia Physical Anesthesia  Plan  ASA: 3  Anesthesia Plan: General   Post-op Pain Management: Precedex and Ofirmev IV (intra-op)*   Induction: Intravenous  PONV Risk Score and Plan: 2 and Treatment may vary due to age or medical condition, Midazolam, Ondansetron and Dexamethasone  Airway Management Planned: LMA  Additional Equipment: None  Intra-op Plan:   Post-operative Plan:   Informed Consent: I have reviewed the patients History and Physical, chart, labs and discussed the procedure including the risks, benefits and alternatives for the proposed anesthesia with the patient or authorized representative who has indicated his/her understanding and acceptance.     Dental advisory given  Plan Discussed with:   Anesthesia Plan Comments:        Anesthesia Quick Evaluation

## 2022-07-27 NOTE — Anesthesia Procedure Notes (Signed)
Procedure Name: LMA Insertion Date/Time: 07/27/2022 3:37 PM  Performed by: Demetrio Lapping, CRNAPre-anesthesia Checklist: Patient identified, Emergency Drugs available, Suction available and Patient being monitored Patient Re-evaluated:Patient Re-evaluated prior to induction Oxygen Delivery Method: Circle System Utilized Preoxygenation: Pre-oxygenation with 100% oxygen Induction Type: IV induction Ventilation: Mask ventilation without difficulty LMA: LMA inserted LMA Size: 5.0 Number of attempts: 1 Airway Equipment and Method: Bite block Placement Confirmation: positive ETCO2 Tube secured with: Tape Dental Injury: Teeth and Oropharynx as per pre-operative assessment

## 2022-07-27 NOTE — Op Note (Signed)
07/27/2022  6:59 PM  PATIENT:  Russell Shaw    PRE-OPERATIVE DIAGNOSIS:  POST OP LEFT KNEE INFECTION  POST-OPERATIVE DIAGNOSIS:  Same  PROCEDURE:  INCISION AND DRAINAGE ABSCESS  SURGEON:  Sheral Apley, MD  ASSISTANT: Levester Fresh, PA-C, he was present and scrubbed throughout the case, critical for completion in a timely fashion, and for retraction, instrumentation, and closure.   ANESTHESIA:   gen  PREOPERATIVE INDICATIONS:  Russell Shaw is a  58 y.o. male with a diagnosis of POST OP LEFT KNEE INFECTION who failed conservative measures and elected for surgical management.    The risks benefits and alternatives were discussed with the patient preoperatively including but not limited to the risks of infection, bleeding, nerve injury, cardiopulmonary complications, the need for revision surgery, among others, and the patient was willing to proceed.  OPERATIVE IMPLANTS: none  OPERATIVE FINDINGS: effusion  BLOOD LOSS: min  COMPLICATIONS: none  TOURNIQUET TIME: noneo  OPERATIVE PROCEDURE:  Patient was identified in the preoperative holding area and site was marked by me He was transported to the operating theater and placed on the table in supine position taking care to pad all bony prominences. After a preincinduction time out anesthesia was induced. The left lower extremity was prepped and draped in normal sterile fashion and a pre-incision timeout was performed. He received ancef for preoperative antibiotics.   I reopened inferior medial and lateral arthroscopic portals.  I created a superior lateral patellar portal.  I inserted the arthroscope and examined all compartments of the knee I debrided synovium where possible with a shaver  I ran 5 bags of saline 15 L through his knee  I performed an extensive debridement  I then placed a Hemovac drain into the superior pouch of the knee  Portals were then closed drain was secured with a stitch sterile dressing was  applied he was taken the PACU in stable condition  POST OPERATIVE PLAN: Brace weightbearing as tolerated

## 2022-07-27 NOTE — Interval H&P Note (Signed)
History and Physical Interval Note:  07/27/2022 1:28 PM  Russell Shaw  has presented today for surgery, with the diagnosis of POST OP LEFT KNEE INFECTION.  The various methods of treatment have been discussed with the patient and family. After consideration of risks, benefits and other options for treatment, the patient has consented to  Procedure(s): INCISION AND DRAINAGE ABSCESS (Left) as a surgical intervention.  The patient's history has been reviewed, patient examined, no change in status, stable for surgery.  I have reviewed the patient's chart and labs.  Questions were answered to the patient's satisfaction.     Sheral Apley

## 2022-07-28 ENCOUNTER — Encounter (HOSPITAL_BASED_OUTPATIENT_CLINIC_OR_DEPARTMENT_OTHER): Payer: Self-pay | Admitting: Orthopedic Surgery

## 2022-07-30 ENCOUNTER — Inpatient Hospital Stay: Payer: 59 | Admitting: Family

## 2022-08-02 ENCOUNTER — Encounter: Payer: Self-pay | Admitting: Family

## 2022-08-02 ENCOUNTER — Telehealth: Payer: Self-pay

## 2022-08-02 ENCOUNTER — Other Ambulatory Visit: Payer: Self-pay

## 2022-08-02 ENCOUNTER — Ambulatory Visit (INDEPENDENT_AMBULATORY_CARE_PROVIDER_SITE_OTHER): Payer: 59 | Admitting: Family

## 2022-08-02 VITALS — BP 112/74 | HR 86 | Resp 16 | Ht 69.0 in | Wt 208.8 lb

## 2022-08-02 DIAGNOSIS — Z95828 Presence of other vascular implants and grafts: Secondary | ICD-10-CM | POA: Insufficient documentation

## 2022-08-02 DIAGNOSIS — M00062 Staphylococcal arthritis, left knee: Secondary | ICD-10-CM | POA: Diagnosis not present

## 2022-08-02 NOTE — Patient Instructions (Addendum)
Nice to see you.  Continue to take your antibiotics as prescribed.   Plan for follow up in 3 weeks or sooner if needed with lab work on the same day.  Have a great day and stay safe!

## 2022-08-02 NOTE — Telephone Encounter (Signed)
Per Marcos Eke: Extending IV abx one day End Date: 08/24/22 Ok to pull picc after 08/24/22. [facility] informed

## 2022-08-02 NOTE — Assessment & Plan Note (Signed)
PICC line in right upper extremity functioning appropriately with no signs of infection or other complication. Continue care per protocol.

## 2022-08-02 NOTE — Assessment & Plan Note (Signed)
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 Reviewed plan of care to include continued antibiotics through 08/24/22 reflecting 4 weeks from last surgery and suspected source control. Continue to monitor lab work with post-surgical wound care per Dr. Eulah Pont.
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
## 2022-08-25 ENCOUNTER — Other Ambulatory Visit: Payer: Self-pay

## 2022-08-25 ENCOUNTER — Ambulatory Visit: Payer: 59 | Admitting: Family

## 2022-08-25 ENCOUNTER — Encounter: Payer: Self-pay | Admitting: Family

## 2022-08-25 VITALS — BP 127/77 | HR 65 | Temp 97.9°F | Resp 16 | Ht 70.0 in | Wt 207.0 lb

## 2022-08-25 DIAGNOSIS — Z95828 Presence of other vascular implants and grafts: Secondary | ICD-10-CM | POA: Diagnosis not present

## 2022-08-25 DIAGNOSIS — M00062 Staphylococcal arthritis, left knee: Secondary | ICD-10-CM

## 2022-08-25 NOTE — Patient Instructions (Addendum)
Nice to see you.  No further antibiotics are needed at this point.   Continue to follow up with Dr. Eulah Pont.   Follow up with ID as needed.   Have a great day and stay safe!

## 2022-08-25 NOTE — Assessment & Plan Note (Signed)
[name] has completed treatment for MSSA left knee septic arthritis with 4 weeks of Cefazolin since his last surgery and has had no further issues/complications. Surgical sites are well healed and edema is minimal with good range of motion. Inflammatory markers are normal. Clinically appears safe to stop antibiotics at this time. Discussed that there is no definitive method to measure resolution of infection outside of stopping antibiotics and when to seek further care for signs of infection. Continue follow up with Dr. Eulah Pont. Follow up with ID as needed.

## 2022-08-25 NOTE — Progress Notes (Unsigned)
PICC Removal    PICC length & location:  right basilic 41 cm Removed per verbal order from: Marcos Eke, NP  Blood thinners:  aspirin 81 mg  Platelet count:  196 ([facility] 08/23/22)  Site assessment: Dressing clean and dry. Extremity warm and dry. No redness, drainage, or swelling present at insertion site.   Pre-removal vital signs:  BP:  123/74 HR:  73 SpO2:  98%  Insertion site positioned below level of heart. No sutures present. Insertion site cleaned with CHG, catheter removed and petroleum dressing applied. Tip intact. Pressure held until hemostasis achieved.    Length of catheter removed:  41 cm   Provided patient with after care instructions and precautions print out (via [employer]). Reviewed this information with patient.   Patient verbalized understanding and agreement, all questions answered. Patient tolerated procedure well and remained in clinic under the care of RN 30 minutes post removal.  Post-observation vital signs:  BP:  127/77 HR:  65 SpO2:  98%  Notified Jeri Modena, RN with [facility] and [facility] pharmacy team of removal.  Sandie Ano, RN

## 2022-08-25 NOTE — Assessment & Plan Note (Signed)
PICC line is clean and intact and will be removed in the clinic today.
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
## 2023-07-26 ENCOUNTER — Other Ambulatory Visit: Payer: Self-pay

## 2023-07-26 ENCOUNTER — Ambulatory Visit: Attending: Orthopedic Surgery | Admitting: Physical Therapy

## 2023-07-26 ENCOUNTER — Encounter: Payer: Self-pay | Admitting: Physical Therapy

## 2023-07-26 DIAGNOSIS — G8929 Other chronic pain: Secondary | ICD-10-CM | POA: Insufficient documentation

## 2023-07-26 DIAGNOSIS — M25562 Pain in left knee: Secondary | ICD-10-CM | POA: Diagnosis present

## 2023-07-26 DIAGNOSIS — R600 Localized edema: Secondary | ICD-10-CM | POA: Insufficient documentation

## 2023-07-26 DIAGNOSIS — M25662 Stiffness of left knee, not elsewhere classified: Secondary | ICD-10-CM | POA: Diagnosis present

## 2023-07-26 DIAGNOSIS — M6281 Muscle weakness (generalized): Secondary | ICD-10-CM | POA: Insufficient documentation
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
## 2023-07-29 ENCOUNTER — Encounter: Payer: Self-pay | Admitting: Physical Therapy

## 2023-07-29 ENCOUNTER — Ambulatory Visit: Admitting: Physical Therapy

## 2023-07-29 DIAGNOSIS — M25562 Pain in left knee: Secondary | ICD-10-CM | POA: Diagnosis not present

## 2023-07-29 DIAGNOSIS — G8929 Other chronic pain: Secondary | ICD-10-CM

## 2023-07-29 DIAGNOSIS — M25662 Stiffness of left knee, not elsewhere classified: Secondary | ICD-10-CM

## 2023-07-29 DIAGNOSIS — M6281 Muscle weakness (generalized): Secondary | ICD-10-CM

## 2023-07-29 DIAGNOSIS — R600 Localized edema: Secondary | ICD-10-CM
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
## 2023-08-01 ENCOUNTER — Ambulatory Visit: Admitting: *Deleted

## 2023-08-01 ENCOUNTER — Encounter: Payer: Self-pay | Admitting: *Deleted

## 2023-08-01 DIAGNOSIS — M6281 Muscle weakness (generalized): Secondary | ICD-10-CM

## 2023-08-01 DIAGNOSIS — R600 Localized edema: Secondary | ICD-10-CM

## 2023-08-01 DIAGNOSIS — G8929 Other chronic pain: Secondary | ICD-10-CM

## 2023-08-01 DIAGNOSIS — M25662 Stiffness of left knee, not elsewhere classified: Secondary | ICD-10-CM

## 2023-08-01 DIAGNOSIS — M25562 Pain in left knee: Secondary | ICD-10-CM | POA: Diagnosis not present
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
## 2023-08-04 ENCOUNTER — Ambulatory Visit: Admitting: *Deleted

## 2023-08-04 ENCOUNTER — Encounter: Payer: Self-pay | Admitting: *Deleted

## 2023-08-04 DIAGNOSIS — M6281 Muscle weakness (generalized): Secondary | ICD-10-CM

## 2023-08-04 DIAGNOSIS — M25662 Stiffness of left knee, not elsewhere classified: Secondary | ICD-10-CM

## 2023-08-04 DIAGNOSIS — R600 Localized edema: Secondary | ICD-10-CM

## 2023-08-04 DIAGNOSIS — G8929 Other chronic pain: Secondary | ICD-10-CM

## 2023-08-04 DIAGNOSIS — M25562 Pain in left knee: Secondary | ICD-10-CM | POA: Diagnosis not present
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
## 2023-08-09 ENCOUNTER — Ambulatory Visit

## 2023-08-09 DIAGNOSIS — R600 Localized edema: Secondary | ICD-10-CM

## 2023-08-09 DIAGNOSIS — M6281 Muscle weakness (generalized): Secondary | ICD-10-CM

## 2023-08-09 DIAGNOSIS — M25662 Stiffness of left knee, not elsewhere classified: Secondary | ICD-10-CM

## 2023-08-09 DIAGNOSIS — M25562 Pain in left knee: Secondary | ICD-10-CM | POA: Diagnosis not present

## 2023-08-09 DIAGNOSIS — G8929 Other chronic pain: Secondary | ICD-10-CM
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
## 2023-08-11 ENCOUNTER — Ambulatory Visit: Admitting: Physical Therapy

## 2023-08-11 ENCOUNTER — Encounter: Payer: Self-pay | Admitting: Physical Therapy

## 2023-08-11 DIAGNOSIS — R600 Localized edema: Secondary | ICD-10-CM

## 2023-08-11 DIAGNOSIS — M6281 Muscle weakness (generalized): Secondary | ICD-10-CM

## 2023-08-11 DIAGNOSIS — M25662 Stiffness of left knee, not elsewhere classified: Secondary | ICD-10-CM

## 2023-08-11 DIAGNOSIS — M25562 Pain in left knee: Secondary | ICD-10-CM | POA: Diagnosis not present

## 2023-08-11 DIAGNOSIS — G8929 Other chronic pain: Secondary | ICD-10-CM
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
## 2023-08-15 ENCOUNTER — Ambulatory Visit

## 2023-08-15 DIAGNOSIS — M25662 Stiffness of left knee, not elsewhere classified: Secondary | ICD-10-CM

## 2023-08-15 DIAGNOSIS — M6281 Muscle weakness (generalized): Secondary | ICD-10-CM

## 2023-08-15 DIAGNOSIS — G8929 Other chronic pain: Secondary | ICD-10-CM

## 2023-08-15 DIAGNOSIS — R600 Localized edema: Secondary | ICD-10-CM

## 2023-08-15 DIAGNOSIS — M25562 Pain in left knee: Secondary | ICD-10-CM | POA: Diagnosis not present
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
## 2023-08-17 ENCOUNTER — Ambulatory Visit: Attending: Orthopedic Surgery | Admitting: Physical Therapy

## 2023-08-17 DIAGNOSIS — M25662 Stiffness of left knee, not elsewhere classified: Secondary | ICD-10-CM | POA: Insufficient documentation

## 2023-08-17 DIAGNOSIS — M6281 Muscle weakness (generalized): Secondary | ICD-10-CM | POA: Diagnosis present

## 2023-08-17 DIAGNOSIS — G8929 Other chronic pain: Secondary | ICD-10-CM | POA: Insufficient documentation

## 2023-08-17 DIAGNOSIS — M25562 Pain in left knee: Secondary | ICD-10-CM | POA: Diagnosis present

## 2023-08-17 DIAGNOSIS — R600 Localized edema: Secondary | ICD-10-CM | POA: Insufficient documentation
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
## 2023-08-22 ENCOUNTER — Encounter: Payer: Self-pay | Admitting: Physical Therapy

## 2023-08-22 ENCOUNTER — Ambulatory Visit: Admitting: Physical Therapy

## 2023-08-22 DIAGNOSIS — G8929 Other chronic pain: Secondary | ICD-10-CM

## 2023-08-22 DIAGNOSIS — R600 Localized edema: Secondary | ICD-10-CM

## 2023-08-22 DIAGNOSIS — M25562 Pain in left knee: Secondary | ICD-10-CM | POA: Diagnosis not present

## 2023-08-22 DIAGNOSIS — M6281 Muscle weakness (generalized): Secondary | ICD-10-CM

## 2023-08-22 DIAGNOSIS — M25662 Stiffness of left knee, not elsewhere classified: Secondary | ICD-10-CM
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
## 2023-08-24 ENCOUNTER — Ambulatory Visit: Admitting: Physical Therapy

## 2023-08-24 ENCOUNTER — Encounter: Payer: Self-pay | Admitting: Physical Therapy

## 2023-08-24 DIAGNOSIS — R600 Localized edema: Secondary | ICD-10-CM

## 2023-08-24 DIAGNOSIS — M25562 Pain in left knee: Secondary | ICD-10-CM | POA: Diagnosis not present

## 2023-08-24 DIAGNOSIS — M25662 Stiffness of left knee, not elsewhere classified: Secondary | ICD-10-CM

## 2023-08-24 DIAGNOSIS — M6281 Muscle weakness (generalized): Secondary | ICD-10-CM

## 2023-08-24 DIAGNOSIS — G8929 Other chronic pain: Secondary | ICD-10-CM
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
## 2023-08-29 ENCOUNTER — Ambulatory Visit: Admitting: Physical Therapy

## 2023-08-29 DIAGNOSIS — M25662 Stiffness of left knee, not elsewhere classified: Secondary | ICD-10-CM

## 2023-08-29 DIAGNOSIS — M6281 Muscle weakness (generalized): Secondary | ICD-10-CM

## 2023-08-29 DIAGNOSIS — M25562 Pain in left knee: Secondary | ICD-10-CM | POA: Diagnosis not present

## 2023-08-29 DIAGNOSIS — R600 Localized edema: Secondary | ICD-10-CM

## 2023-08-29 DIAGNOSIS — G8929 Other chronic pain: Secondary | ICD-10-CM
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
## 2023-08-31 ENCOUNTER — Ambulatory Visit: Admitting: Physical Therapy

## 2023-08-31 DIAGNOSIS — R600 Localized edema: Secondary | ICD-10-CM

## 2023-08-31 DIAGNOSIS — M25662 Stiffness of left knee, not elsewhere classified: Secondary | ICD-10-CM

## 2023-08-31 DIAGNOSIS — M6281 Muscle weakness (generalized): Secondary | ICD-10-CM

## 2023-08-31 DIAGNOSIS — G8929 Other chronic pain: Secondary | ICD-10-CM

## 2023-08-31 DIAGNOSIS — M25562 Pain in left knee: Secondary | ICD-10-CM | POA: Diagnosis not present
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
## 2023-09-05 ENCOUNTER — Ambulatory Visit: Admitting: Physical Therapy

## 2023-09-05 ENCOUNTER — Encounter: Payer: Self-pay | Admitting: Physical Therapy

## 2023-09-05 DIAGNOSIS — M25562 Pain in left knee: Secondary | ICD-10-CM | POA: Diagnosis not present

## 2023-09-05 DIAGNOSIS — M25662 Stiffness of left knee, not elsewhere classified: Secondary | ICD-10-CM

## 2023-09-05 DIAGNOSIS — M6281 Muscle weakness (generalized): Secondary | ICD-10-CM

## 2023-09-05 DIAGNOSIS — G8929 Other chronic pain: Secondary | ICD-10-CM

## 2023-09-05 DIAGNOSIS — R600 Localized edema: Secondary | ICD-10-CM
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
## 2023-09-07 ENCOUNTER — Ambulatory Visit: Admitting: Physical Therapy

## 2023-09-07 DIAGNOSIS — M25662 Stiffness of left knee, not elsewhere classified: Secondary | ICD-10-CM

## 2023-09-07 DIAGNOSIS — G8929 Other chronic pain: Secondary | ICD-10-CM

## 2023-09-07 DIAGNOSIS — M25562 Pain in left knee: Secondary | ICD-10-CM | POA: Diagnosis not present

## 2023-09-07 DIAGNOSIS — R600 Localized edema: Secondary | ICD-10-CM
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
## 2023-09-19 ENCOUNTER — Encounter: Payer: Self-pay | Admitting: *Deleted

## 2023-09-19 ENCOUNTER — Ambulatory Visit: Attending: Orthopedic Surgery | Admitting: *Deleted

## 2023-09-19 DIAGNOSIS — M25662 Stiffness of left knee, not elsewhere classified: Secondary | ICD-10-CM | POA: Diagnosis present

## 2023-09-19 DIAGNOSIS — M25562 Pain in left knee: Secondary | ICD-10-CM | POA: Diagnosis present

## 2023-09-19 DIAGNOSIS — R600 Localized edema: Secondary | ICD-10-CM | POA: Diagnosis present

## 2023-09-19 DIAGNOSIS — M6281 Muscle weakness (generalized): Secondary | ICD-10-CM | POA: Diagnosis present

## 2023-09-19 DIAGNOSIS — G8929 Other chronic pain: Secondary | ICD-10-CM | POA: Insufficient documentation
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
## 2023-09-21 ENCOUNTER — Ambulatory Visit: Admitting: Physical Therapy
